# Patient Record
Sex: Female | Born: 1975 | Race: Black or African American | Hispanic: No | Smoking: Current every day smoker
Health system: Southern US, Community
[De-identification: ages and names within clinical notes are randomized; demographics above are authoritative.]

## PROBLEM LIST (undated history)

## (undated) DIAGNOSIS — R112 Nausea with vomiting, unspecified: Secondary | ICD-10-CM

## (undated) DIAGNOSIS — D259 Leiomyoma of uterus, unspecified: Secondary | ICD-10-CM

## (undated) DIAGNOSIS — Z973 Presence of spectacles and contact lenses: Secondary | ICD-10-CM

## (undated) DIAGNOSIS — M1990 Unspecified osteoarthritis, unspecified site: Secondary | ICD-10-CM

## (undated) DIAGNOSIS — T8859XA Other complications of anesthesia, initial encounter: Secondary | ICD-10-CM

## (undated) DIAGNOSIS — Z87898 Personal history of other specified conditions: Secondary | ICD-10-CM

## (undated) DIAGNOSIS — J3089 Other allergic rhinitis: Secondary | ICD-10-CM

## (undated) DIAGNOSIS — F4310 Post-traumatic stress disorder, unspecified: Secondary | ICD-10-CM

## (undated) DIAGNOSIS — Z9889 Other specified postprocedural states: Secondary | ICD-10-CM

## (undated) DIAGNOSIS — F419 Anxiety disorder, unspecified: Secondary | ICD-10-CM

## (undated) DIAGNOSIS — J45909 Unspecified asthma, uncomplicated: Secondary | ICD-10-CM

## (undated) DIAGNOSIS — Z975 Presence of (intrauterine) contraceptive device: Secondary | ICD-10-CM

## (undated) DIAGNOSIS — K219 Gastro-esophageal reflux disease without esophagitis: Secondary | ICD-10-CM

## (undated) DIAGNOSIS — Z87828 Personal history of other (healed) physical injury and trauma: Secondary | ICD-10-CM

## (undated) DIAGNOSIS — T4145XA Adverse effect of unspecified anesthetic, initial encounter: Secondary | ICD-10-CM

## (undated) HISTORY — PX: OTHER SURGICAL HISTORY: SHX169

---

## 2011-02-18 ENCOUNTER — Ambulatory Visit
Admission: RE | Admit: 2011-02-18 | Discharge: 2011-02-18 | Disposition: A | Discharge status: Home / Self Care | Payer: Worker's Compensation | Source: Ambulatory Visit | Attending: Emergency Medicine | Admitting: Emergency Medicine

## 2011-02-18 ENCOUNTER — Other Ambulatory Visit: Payer: Self-pay | Admitting: Emergency Medicine

## 2011-02-18 DIAGNOSIS — T1490XA Injury, unspecified, initial encounter: Secondary | ICD-10-CM

## 2011-02-18 DIAGNOSIS — R52 Pain, unspecified: Secondary | ICD-10-CM

## 2012-04-23 NOTE — Progress Notes (Signed)
Need orders put in EPIC as patient has pre-op appt. Mon. 04/26/2012! Thank you!

## 2012-04-26 ENCOUNTER — Encounter (HOSPITAL_COMMUNITY): Payer: Self-pay | Admitting: Pharmacy Technician

## 2012-04-26 ENCOUNTER — Encounter (HOSPITAL_COMMUNITY): Payer: Self-pay

## 2012-04-26 ENCOUNTER — Encounter (HOSPITAL_COMMUNITY)
Admission: RE | Admit: 2012-04-26 | Discharge: 2012-04-26 | Disposition: A | Discharge status: Home / Self Care | Payer: Worker's Compensation | Source: Ambulatory Visit | Attending: Specialist | Admitting: Specialist

## 2012-04-26 ENCOUNTER — Other Ambulatory Visit: Payer: Self-pay | Admitting: Specialist

## 2012-04-26 HISTORY — DX: Gastro-esophageal reflux disease without esophagitis: K21.9

## 2012-04-26 HISTORY — DX: Adverse effect of unspecified anesthetic, initial encounter: T41.45XA

## 2012-04-26 HISTORY — DX: Presence of (intrauterine) contraceptive device: Z97.5

## 2012-04-26 HISTORY — DX: Other complications of anesthesia, initial encounter: T88.59XA

## 2012-04-26 HISTORY — DX: Unspecified osteoarthritis, unspecified site: M19.90

## 2012-04-26 LAB — HCG, SERUM, QUALITATIVE: Preg, Serum: NEGATIVE

## 2012-04-26 LAB — CBC: Platelets: 344 10*3/uL (ref 150–400)

## 2012-04-26 NOTE — Pre-Procedure Instructions (Signed)
CBC, SERUM PREGNANCY  WERE DONE TODAY - PREOP - AT [facility] AS PER ANESTHESIOLOGIST'S GUIDELINES.

## 2012-04-26 NOTE — Patient Instructions (Signed)
YOUR SURGERY IS SCHEDULED AT [facility]  ON:  Wednesday  12/18  REPORT TO [place] [facility] AT:  9:00 AM      PHONE # FOR SHORT STAY IS [phone]  DO NOT EAT OR DRINK ANYTHING AFTER MIDNIGHT THE NIGHT BEFORE YOUR SURGERY.  YOU MAY BRUSH YOUR TEETH, RINSE OUT YOUR MOUTH--BUT NO WATER, NO FOOD, NO CHEWING GUM, NO MINTS, NO CANDIES, NO CHEWING TOBACCO.  PLEASE TAKE THE FOLLOWING MEDICATIONS THE AM OF YOUR SURGERY WITH A FEW SIPS OF WATER:  NO MEDS TO TAKE  IF YOU USE INHALERS--USE YOUR INHALERS THE AM OF YOUR SURGERY AND BRING INHALERS TO THE HOSPITAL -TAKE TO SURGERY.    IF YOU ARE DIABETIC:  DO NOT TAKE ANY DIABETIC MEDICATIONS THE AM OF YOUR SURGERY.  IF YOU TAKE INSULIN IN THE EVENINGS--PLEASE ONLY TAKE 1/2 NORMAL EVENING DOSE THE NIGHT BEFORE YOUR SURGERY.  NO INSULIN THE AM OF YOUR SURGERY.  IF YOU HAVE SLEEP APNEA AND USE CPAP OR BIPAP--PLEASE BRING THE MASK AND THE TUBING.  DO NOT BRING YOUR MACHINE.  DO NOT BRING VALUABLES, MONEY, CREDIT CARDS.  DO NOT WEAR JEWELRY, MAKE-UP, NAIL POLISH AND NO METAL PINS OR CLIPS IN YOUR HAIR. CONTACT LENS, DENTURES / PARTIALS, GLASSES SHOULD NOT BE WORN TO SURGERY AND IN MOST CASES-HEARING AIDS WILL NEED TO BE REMOVED.  BRING YOUR GLASSES CASE, ANY EQUIPMENT NEEDED FOR YOUR CONTACT LENS. FOR PATIENTS ADMITTED TO THE HOSPITAL--CHECK OUT TIME THE DAY OF DISCHARGE IS 11:00 AM.  ALL INPATIENT ROOMS ARE PRIVATE - WITH BATHROOM, TELEPHONE, TELEVISION AND WIFI INTERNET.  IF YOU ARE BEING DISCHARGED THE SAME DAY OF YOUR SURGERY--YOU CAN NOT DRIVE YOURSELF HOME--AND SHOULD NOT GO HOME ALONE BY TAXI OR BUS.  NO DRIVING OR OPERATING MACHINERY FOR 24 HOURS FOLLOWING ANESTHESIA / PAIN MEDICATIONS.  PLEASE MAKE ARRANGEMENTS FOR SOMEONE TO BE WITH YOU AT HOME THE FIRST 24 HOURS AFTER SURGERY. RESPONSIBLE DRIVER'S NAME___________________________                                               PHONE #   _______________________               PLEASE READ OVER ANY  FACT SHEETS THAT YOU WERE GIVEN: MRSA INFORMATION, BLOOD TRANSFUSION INFORMATION, INCENTIVE SPIROMETER INFORMATION. FAILURE TO FOLLOW THESE INSTRUCTIONS MAY RESULT IN THE CANCELLATION OF YOUR SURGERY.   PATIENT SIGNATURE_________________________________

## 2012-04-27 ENCOUNTER — Other Ambulatory Visit: Payer: Self-pay | Admitting: Orthopedic Surgery

## 2012-04-28 ENCOUNTER — Encounter (HOSPITAL_COMMUNITY): Payer: Self-pay | Admitting: *Deleted

## 2012-04-28 ENCOUNTER — Ambulatory Visit (HOSPITAL_COMMUNITY): Payer: Worker's Compensation

## 2012-04-28 ENCOUNTER — Ambulatory Visit (HOSPITAL_COMMUNITY): Payer: Worker's Compensation | Admitting: Anesthesiology

## 2012-04-28 ENCOUNTER — Encounter (HOSPITAL_COMMUNITY): Payer: Self-pay | Admitting: Anesthesiology

## 2012-04-28 ENCOUNTER — Encounter (HOSPITAL_COMMUNITY)
Admission: RE | Disposition: A | Discharge status: Home / Self Care | Payer: Self-pay | Source: Ambulatory Visit | Attending: Specialist

## 2012-04-28 ENCOUNTER — Ambulatory Visit (HOSPITAL_COMMUNITY)
Admission: RE | Admit: 2012-04-28 | Discharge: 2012-04-28 | Disposition: A | Discharge status: Home / Self Care | Payer: Worker's Compensation | Source: Ambulatory Visit | Attending: Specialist | Admitting: Specialist

## 2012-04-28 DIAGNOSIS — M7500 Adhesive capsulitis of unspecified shoulder: Secondary | ICD-10-CM | POA: Insufficient documentation

## 2012-04-28 DIAGNOSIS — Z5333 Arthroscopic surgical procedure converted to open procedure: Secondary | ICD-10-CM | POA: Insufficient documentation

## 2012-04-28 DIAGNOSIS — M24119 Other articular cartilage disorders, unspecified shoulder: Secondary | ICD-10-CM | POA: Insufficient documentation

## 2012-04-28 DIAGNOSIS — M25819 Other specified joint disorders, unspecified shoulder: Secondary | ICD-10-CM | POA: Insufficient documentation

## 2012-04-28 DIAGNOSIS — M19019 Primary osteoarthritis, unspecified shoulder: Secondary | ICD-10-CM | POA: Insufficient documentation

## 2012-04-28 DIAGNOSIS — M7541 Impingement syndrome of right shoulder: Secondary | ICD-10-CM

## 2012-04-28 DIAGNOSIS — Z01812 Encounter for preprocedural laboratory examination: Secondary | ICD-10-CM | POA: Insufficient documentation

## 2012-04-28 DIAGNOSIS — Z79899 Other long term (current) drug therapy: Secondary | ICD-10-CM | POA: Insufficient documentation

## 2012-04-28 DIAGNOSIS — K219 Gastro-esophageal reflux disease without esophagitis: Secondary | ICD-10-CM | POA: Insufficient documentation

## 2012-04-28 HISTORY — PX: SHOULDER OPEN ROTATOR CUFF REPAIR: SHX2407

## 2012-04-28 HISTORY — PX: SHOULDER ARTHROSCOPY: SHX128

## 2012-04-28 SURGERY — ARTHROSCOPY, SHOULDER
Anesthesia: General | Site: Shoulder | Laterality: Right | Wound class: Clean

## 2012-04-28 MED ORDER — LIDOCAINE HCL (CARDIAC) 20 MG/ML IV SOLN
INTRAVENOUS | Status: DC | PRN
  Administered 2012-04-28: 50 mg via INTRAVENOUS

## 2012-04-28 MED ORDER — HYDROMORPHONE HCL PF 1 MG/ML IJ SOLN
INTRAMUSCULAR | Status: DC | PRN
  Administered 2012-04-28 (×4): 0.5 mg via INTRAVENOUS

## 2012-04-28 MED ORDER — HYDROCODONE-ACETAMINOPHEN 7.5-325 MG PO TABS
1.0000 | ORAL_TABLET | ORAL | Status: DC | PRN
  Administered 2012-04-28: 1 via ORAL

## 2012-04-28 MED ORDER — ROCURONIUM BROMIDE 100 MG/10ML IV SOLN
INTRAVENOUS | Status: DC | PRN
  Administered 2012-04-28: 50 mg via INTRAVENOUS

## 2012-04-28 MED ORDER — CEPHALEXIN 500 MG PO CAPS: 500.0000 mg | ORAL_CAPSULE | Freq: Four times a day (QID) | ORAL | Status: DC

## 2012-04-28 MED ORDER — ONDANSETRON HCL 4 MG/2ML IJ SOLN
INTRAMUSCULAR | Status: DC | PRN
  Administered 2012-04-28: 4 mg via INTRAVENOUS

## 2012-04-28 MED ORDER — LACTATED RINGERS IV SOLN
INTRAVENOUS | Status: DC
  Administered 2012-04-28: 1000 mL via INTRAVENOUS
  Administered 2012-04-28: 12:00:00 via INTRAVENOUS

## 2012-04-28 MED ORDER — HYDROMORPHONE HCL PF 1 MG/ML IJ SOLN: 0.2500 mg | INTRAMUSCULAR | Status: DC | PRN

## 2012-04-28 MED ORDER — BUPIVACAINE-EPINEPHRINE 0.5% -1:200000 IJ SOLN
INTRAMUSCULAR | Status: DC | PRN
  Administered 2012-04-28: 30 mL

## 2012-04-28 MED ORDER — ACETAMINOPHEN 10 MG/ML IV SOLN
INTRAVENOUS | Status: DC | PRN
  Administered 2012-04-28: 1000 mg via INTRAVENOUS

## 2012-04-28 MED ORDER — KETOROLAC TROMETHAMINE 10 MG PO TABS: 10.0000 mg | ORAL_TABLET | Freq: Four times a day (QID) | ORAL | Status: DC | PRN

## 2012-04-28 MED ORDER — HYDROCODONE-ACETAMINOPHEN 7.5-325 MG PO TABS: 1.0000 | ORAL_TABLET | ORAL | Status: DC | PRN

## 2012-04-28 MED ORDER — KETOROLAC TROMETHAMINE 30 MG/ML IJ SOLN
15.0000 mg | Freq: Once | INTRAMUSCULAR | Status: AC | PRN
  Administered 2012-04-28: 30 mg via INTRAVENOUS

## 2012-04-28 MED ORDER — FENTANYL CITRATE 0.05 MG/ML IJ SOLN
INTRAMUSCULAR | Status: DC | PRN
  Administered 2012-04-28 (×2): 100 ug via INTRAVENOUS

## 2012-04-28 MED ORDER — SODIUM CHLORIDE 0.9 % IR SOLN
Status: DC | PRN
  Administered 2012-04-28: 12:00:00

## 2012-04-28 MED ORDER — PROPOFOL 10 MG/ML IV BOLUS
INTRAVENOUS | Status: DC | PRN
  Administered 2012-04-28: 200 mg via INTRAVENOUS

## 2012-04-28 MED ORDER — METOCLOPRAMIDE HCL 5 MG/ML IJ SOLN
INTRAMUSCULAR | Status: DC | PRN
  Administered 2012-04-28: 10 mg via INTRAVENOUS

## 2012-04-28 MED ORDER — MIDAZOLAM HCL 5 MG/5ML IJ SOLN
INTRAMUSCULAR | Status: DC | PRN
  Administered 2012-04-28: 2 mg via INTRAVENOUS

## 2012-04-28 MED ORDER — ONDANSETRON HCL 8 MG PO TABS: 8.0000 mg | ORAL_TABLET | Freq: Three times a day (TID) | ORAL | Status: DC | PRN

## 2012-04-28 MED ORDER — DEXTROSE 5 % IV SOLN
3.0000 g | INTRAVENOUS | Status: AC
  Administered 2012-04-28: 2 g via INTRAVENOUS
  Filled 2012-04-28: qty 3000

## 2012-04-28 MED ORDER — PROMETHAZINE HCL 25 MG/ML IJ SOLN
6.2500 mg | INTRAMUSCULAR | Status: DC | PRN
  Administered 2012-04-28: 6.25 mg via INTRAVENOUS
  Filled 2012-04-28: qty 1

## 2012-04-28 MED ORDER — CHLORHEXIDINE GLUCONATE 4 % EX LIQD
60.0000 mL | Freq: Once | CUTANEOUS | Status: DC
  Filled 2012-04-28: qty 60

## 2012-04-28 MED ORDER — DEXAMETHASONE SODIUM PHOSPHATE 10 MG/ML IJ SOLN
INTRAMUSCULAR | Status: DC | PRN
  Administered 2012-04-28: 5 mg via INTRAVENOUS

## 2012-04-28 MED ORDER — LACTATED RINGERS IR SOLN
Status: DC | PRN
  Administered 2012-04-28: 6000 mL

## 2012-04-28 MED ORDER — EPINEPHRINE HCL 1 MG/ML IJ SOLN
INTRAMUSCULAR | Status: DC | PRN
  Administered 2012-04-28: 2 mg

## 2012-04-28 SURGICAL SUPPLY — 65 items
ANCHOR ROTATOR CUFF #2 (Anchor) ×4 IMPLANT
BAG ZIPLOCK 12X15 (MISCELLANEOUS) ×2 IMPLANT
BENZOIN TINCTURE PRP APPL 2/3 (GAUZE/BANDAGES/DRESSINGS) ×2 IMPLANT
BLADE CUTTER GATOR 3.5 (BLADE) ×2 IMPLANT
BLADE OSCILLATING/SAGITTAL (BLADE) ×1
BLADE SURG SZ11 CARB STEEL (BLADE) ×2 IMPLANT
BLADE SW THK.38XMED LNG THN (BLADE) ×1 IMPLANT
BNDG COHESIVE 4X5 WHT NS (GAUZE/BANDAGES/DRESSINGS) ×2 IMPLANT
BUR OVAL 4.0 (BURR) ×2 IMPLANT
BUR OVAL CARBIDE 4.0 (BURR) ×2 IMPLANT
CANNULA ACUFO 5X76 (CANNULA) ×2 IMPLANT
CHLORAPREP W/TINT 26ML (MISCELLANEOUS) IMPLANT
CLEANER TIP ELECTROSURG 2X2 (MISCELLANEOUS) ×2 IMPLANT
CLOSURE STERI-STRIP 1/4X4 (GAUZE/BANDAGES/DRESSINGS) ×2 IMPLANT
CLOTH BEACON ORANGE TIMEOUT ST (SAFETY) ×2 IMPLANT
DECANTER SPIKE VIAL GLASS SM (MISCELLANEOUS) ×2 IMPLANT
DRAPE ORTHO SPLIT 77X108 STRL (DRAPES) ×1
DRAPE POUCH INSTRU U-SHP 10X18 (DRAPES) ×2 IMPLANT
DRAPE SURG ORHT 6 SPLT 77X108 (DRAPES) ×1 IMPLANT
DRAPE U-SHAPE 47X51 STRL (DRAPES) ×2 IMPLANT
DRSG EMULSION OIL 3X3 NADH (GAUZE/BANDAGES/DRESSINGS) ×2 IMPLANT
DURAPREP 26ML APPLICATOR (WOUND CARE) ×2 IMPLANT
ELECT NEEDLE TIP 2.8 STRL (NEEDLE) ×2 IMPLANT
ELECT REM PT RETURN 9FT ADLT (ELECTROSURGICAL) ×2
ELECTRODE REM PT RTRN 9FT ADLT (ELECTROSURGICAL) ×1 IMPLANT
GLOVE BIOGEL PI IND STRL 7.5 (GLOVE) ×1 IMPLANT
GLOVE BIOGEL PI IND STRL 8 (GLOVE) ×1 IMPLANT
GLOVE BIOGEL PI INDICATOR 7.5 (GLOVE) ×1
GLOVE BIOGEL PI INDICATOR 8 (GLOVE) ×1
GLOVE INDICATOR 6.5 STRL GRN (GLOVE) ×2 IMPLANT
GLOVE SURG SS PI 7.0 STRL IVOR (GLOVE) ×2 IMPLANT
GLOVE SURG SS PI 8.0 STRL IVOR (GLOVE) ×4 IMPLANT
GOWN PREVENTION PLUS LG XLONG (DISPOSABLE) ×2 IMPLANT
GOWN PREVENTION PLUS XLARGE (GOWN DISPOSABLE) ×2 IMPLANT
GOWN STRL REIN XL XLG (GOWN DISPOSABLE) ×2 IMPLANT
KIT BASIN OR (CUSTOM PROCEDURE TRAY) ×2 IMPLANT
MANIFOLD NEPTUNE II (INSTRUMENTS) ×4 IMPLANT
NEEDLE MA TROC 1/2 (NEEDLE) ×2 IMPLANT
NEEDLE MA TROC 1/2 CIR (NEEDLE) IMPLANT
NEEDLE SPNL 18GX3.5 QUINCKE PK (NEEDLE) ×2 IMPLANT
PACK SHOULDER CUSTOM OPM052 (CUSTOM PROCEDURE TRAY) ×2 IMPLANT
PAD ABD 7.5X8 STRL (GAUZE/BANDAGES/DRESSINGS) ×2 IMPLANT
POSITIONER SURGICAL ARM (MISCELLANEOUS) ×2 IMPLANT
PUSHLOCK PEEK 4.5X24 (Orthopedic Implant) ×4 IMPLANT
SET ARTHROSCOPY TUBING (MISCELLANEOUS) ×1
SET ARTHROSCOPY TUBING LN (MISCELLANEOUS) ×1 IMPLANT
SLING ARM IMMOBILIZER LRG (SOFTGOODS) ×2 IMPLANT
SLING ARM IMMOBILIZER MED (SOFTGOODS) IMPLANT
SLING ULTRA II S (ORTHOPEDIC SUPPLIES) ×2 IMPLANT
SPONGE GAUZE 4X4 12PLY (GAUZE/BANDAGES/DRESSINGS) ×2 IMPLANT
SPONGE LAP 4X18 X RAY DECT (DISPOSABLE) ×2 IMPLANT
STRAP CHIN BCCS-OSI (MISCELLANEOUS) ×2 IMPLANT
STRIP CLOSURE SKIN 1/2X4 (GAUZE/BANDAGES/DRESSINGS) ×2 IMPLANT
SUT BONE WAX W31G (SUTURE) ×2 IMPLANT
SUT ETHIBOND 0 (SUTURE) ×4 IMPLANT
SUT ETHIBOND 2 OS 4 DA (SUTURE) ×4 IMPLANT
SUT ETHILON 4 0 PS 2 18 (SUTURE) ×2 IMPLANT
SUT PROLENE 3 0 PS 2 (SUTURE) ×2 IMPLANT
SUT VIC AB 1-0 CT2 27 (SUTURE) ×4 IMPLANT
SUT VIC AB 2-0 CT2 27 (SUTURE) ×2 IMPLANT
SUT VICRYL 0 UR6 27IN ABS (SUTURE) ×4 IMPLANT
SUT VICRYL 0-0 OS 2 NEEDLE (SUTURE) ×2 IMPLANT
TAPE CLOTH SURG 4X10 WHT LF (GAUZE/BANDAGES/DRESSINGS) ×2 IMPLANT
TUBING CONNECTING 10 (TUBING) ×2 IMPLANT
WAND 90 DEG TURBOVAC W/CORD (SURGICAL WAND) ×2 IMPLANT

## 2012-04-28 NOTE — Progress Notes (Signed)
Pt still sleeping.  She does wake to voice and follows commands but easily back to sleep.

## 2012-04-28 NOTE — Progress Notes (Signed)
Pt has met criteria to be discharged; however, she is waiting on family to come from [place], [place] to get her.

## 2012-04-28 NOTE — H&P (Signed)
Traci Schwartz is an 36 y.o. female.   Chief Complaint: Right shoulder pain HPI: Refractory shoulder pain Frozen shoulder MRI no tear  Past Medical History  Diagnosis Date  . IUD (intrauterine device) in place     HAS OCCAS MENSES  . GERD (gastroesophageal reflux disease)     OCCAS- NO MEDS  . Arthritis   . Pain     RIGHT SHOULDER  . Complication of anesthesia     ANESTHESIA AWARENESS DURING BOTH C-SECTIONS; SEVERE CLAUSTROPHOBIA    Past Surgical History  Procedure Date  . C-sections x 2     History reviewed. No pertinent family history. Social History:  reports that she has been smoking Cigarettes.  She has a .75 pack-year smoking history. She has never used smokeless tobacco. She reports that she drinks alcohol. She reports that she does not use illicit drugs.  Allergies: No Known Allergies  Medications Prior to Admission  Medication Sig Dispense Refill  . acetaminophen (TYLENOL) 500 MG tablet Take 1,000 mg by mouth every 6 (six) hours as needed. Pain      . traMADol (ULTRAM) 50 MG tablet Take 50 mg by mouth every 6 (six) hours as needed. Pain        Results for orders placed during the hospital encounter of 04/26/12 (from the past 48 hour(s))  SURGICAL PCR SCREEN     Status: Normal   Collection Time   04/26/12 10:10 AM      Component Value Range Comment   MRSA, PCR NEGATIVE  NEGATIVE    Staphylococcus aureus NEGATIVE  NEGATIVE   CBC     Status: Normal   Collection Time   04/26/12 11:05 AM      Component Value Range Comment   WBC 9.6  4.0 - 10.5 K/uL    RBC 4.97  3.87 - 5.11 MIL/uL    Hemoglobin 14.2  12.0 - 15.0 g/dL    HCT 13.0  86.5 - 78.4 %    MCV 84.7  78.0 - 100.0 fL    MCH 28.6  26.0 - 34.0 pg    MCHC 33.7  30.0 - 36.0 g/dL    RDW 69.6  29.5 - 28.4 %    Platelets 344  150 - 400 K/uL   HCG, SERUM, QUALITATIVE     Status: Normal   Collection Time   04/26/12 11:05 AM      Component Value Range Comment   Preg, Serum NEGATIVE  NEGATIVE    No results  found.  Review of Systems  Musculoskeletal: Positive for joint pain.  All other systems reviewed and are negative.    Blood pressure 142/80, pulse 75, temperature 98.3 F (36.8 C), temperature source Oral, resp. rate 18, SpO2 100.00%. Physical Exam  Constitutional: She is oriented to person, place, and time. She appears well-developed.  HENT:  Head: Normocephalic.  Eyes: Pupils are equal, round, and reactive to light.  Neck: Normal range of motion. Neck supple.  Cardiovascular: Normal rate.   Respiratory: Effort normal.  GI: Soft.  Musculoskeletal:       +impingement right. Tender AC. Decreased ROM right. NVI.   Neurological: She is alert and oriented to person, place, and time.  Skin: Skin is warm and dry.  Psychiatric: She has a normal mood and affect.   MRI RC tendonosis, mild AC arthrosis. No full thicjness tear.  Assessment/Plan Right shoulder impingement syndrome RC arthropathy, adhesive capsulitis, AC arthrosis refractory Plan Right SA SAD probable DCR, MUA EUA. Risks discussed.  Chloie Loney C 04/28/2012, 10:09 AM

## 2012-04-28 NOTE — Anesthesia Preprocedure Evaluation (Addendum)
Anesthesia Evaluation  Patient identified by MRN, date of birth, ID band Patient awake    Reviewed: Allergy & Precautions, H&P , NPO status , Patient's Chart, lab work & pertinent test results  Airway Mallampati: III TM Distance: <3 FB Neck ROM: Full    Dental No notable dental hx. (+) Dental Advisory Given   Pulmonary Current Smoker,  breath sounds clear to auscultation  + decreased breath sounds      Cardiovascular negative cardio ROS  Rhythm:Regular Rate:Normal     Neuro/Psych PTSDnegative neurological ROS  negative psych ROS   GI/Hepatic Neg liver ROS, GERD-  ,  Endo/Other  Morbid obesity  Renal/GU negative Renal ROS  negative genitourinary   Musculoskeletal negative musculoskeletal ROS (+)   Abdominal   Peds negative pediatric ROS (+)  Hematology negative hematology ROS (+)   Anesthesia Other Findings   Reproductive/Obstetrics negative OB ROS                          Anesthesia Physical Anesthesia Plan  ASA: III  Anesthesia Plan: General   Post-op Pain Management:    Induction: Intravenous  Airway Management Planned: Oral ETT  Additional Equipment:   Intra-op Plan:   Post-operative Plan: Extubation in OR  Informed Consent: I have reviewed the patients History and Physical, chart, labs and discussed the procedure including the risks, benefits and alternatives for the proposed anesthesia with the patient or authorized representative who has indicated his/her understanding and acceptance.   Dental advisory given  Plan Discussed with: CRNA and Surgeon  Anesthesia Plan Comments:         Anesthesia Quick Evaluation

## 2012-04-28 NOTE — Transfer of Care (Signed)
Immediate Anesthesia Transfer of Care Note  Patient: Traci Schwartz  Procedure(s) Performed: Procedure(s) (LRB) with comments: ARTHROSCOPY SHOULDER (Right) - Right Shoulder Arthroscopy with Debridement, Evaluation under Anesthesia, Manipulation Under Anesthesia and Possible Mini Rotator Cuff Repair and Distal Clavicle Resection ROTATOR CUFF REPAIR SHOULDER OPEN (Right)  Patient Location: PACU  Anesthesia Type:General  Level of Consciousness: awake, alert , oriented and patient cooperative  Airway & Oxygen Therapy: Patient Spontanous Breathing and Patient connected to face mask oxygen  Post-op Assessment: Report given to PACU RN, Post -op Vital signs reviewed and stable and Patient moving all extremities  Post vital signs: Reviewed and stable  Complications: No apparent anesthesia complications

## 2012-04-28 NOTE — Anesthesia Postprocedure Evaluation (Signed)
  Anesthesia Post-op Note  Patient: Traci Schwartz  Procedure(s) Performed: Procedure(s) (LRB): ARTHROSCOPY SHOULDER (Right) ROTATOR CUFF REPAIR SHOULDER OPEN (Right)  Patient Location: PACU  Anesthesia Type: General  Level of Consciousness: awake and alert   Airway and Oxygen Therapy: Patient Spontanous Breathing  Post-op Pain: mild  Post-op Assessment: Post-op Vital signs reviewed, Patient's Cardiovascular Status Stable, Respiratory Function Stable, Patent Airway and No signs of Nausea or vomiting  Last Vitals:  Filed Vitals:   04/28/12 1411  BP: 125/75  Pulse: 72  Temp: 36.3 C  Resp: 20    Post-op Vital Signs: stable   Complications: No apparent anesthesia complications

## 2012-04-28 NOTE — Brief Op Note (Signed)
04/28/2012  12:59 PM  PATIENT:  Traci Schwartz  36 y.o. female  PRE-OPERATIVE DIAGNOSIS:  Right Shoulder Impingement Syndrome and Adhesive Capsulitis  POST-OPERATIVE DIAGNOSIS:  Right Shoulder Impingement Syndrome and Adhesive Capsulitis  PROCEDURE:  Procedure(s) (LRB) with comments: ARTHROSCOPY SHOULDER (Right) - Right Shoulder Arthroscopy with Debridement, Evaluation under Anesthesia, Manipulation Under Anesthesia and Possible Mini Rotator Cuff Repair and Distal Clavicle Resection ROTATOR CUFF REPAIR SHOULDER OPEN (Right)  SURGEON:  Surgeon(s) and Role:    * Javier Docker, MD - Primary  PHYSICIAN ASSISTANT:   ASSISTANTS: Bissell   ANESTHESIA:   general  EBL:  Total I/O In: 1000 [I.V.:1000] Out: -   BLOOD ADMINISTERED:none  DRAINS: none   LOCAL MEDICATIONS USED:  MARCAINE     SPECIMEN:  No Specimen  DISPOSITION OF SPECIMEN:  N/A  COUNTS:  YES  TOURNIQUET:  * No tourniquets in log *  DICTATION: .Other Dictation: Dictation Number [id number]  PLAN OF CARE: Discharge to home after PACU  PATIENT DISPOSITION:  PACU - hemodynamically stable.   Delay start of Pharmacological VTE agent (>24hrs) due to surgical blood loss or risk of bleeding: no

## 2012-04-29 ENCOUNTER — Encounter (HOSPITAL_COMMUNITY): Payer: Self-pay | Admitting: Specialist

## 2012-04-29 NOTE — Op Note (Signed)
[name], Schwartz NO.:  [id number]  MEDICAL RECORD NO.:  [id number]  LOCATION:  [facility]                         FACILITY:  [facility]  PHYSICIAN:  Jene Every, M.D.    DATE OF BIRTH:  06-03-1975  DATE OF PROCEDURE:  04/28/2012 DATE OF DISCHARGE:  04/28/2012                              OPERATIVE REPORT   PREOPERATIVE DIAGNOSES:  Impingement syndrome of the shoulder, adhesive capsulitis, labral tear, acromioclavicular arthrosis.  POSTOPERATIVE DIAGNOSES:  Impingement syndrome of the shoulder, adhesive capsulitis, labral tear, acromioclavicular arthrosis.  PROCEDURES PERFORMED: 1. Exam under anesthesia followed by manipulation under anesthesia. 2. Right shoulder arthroscopy with debridement of superior anterior     labrum. 3. Arthroscopic subacromial decompression, release of CA ligament,     arthroscopic assisted mini open distal clavicle resection.  ANESTHESIA:  General.  ASSISTANT:  Norman Herrlich.  HISTORY:  A 36 year old injured at work.  She had persistent pain, limitation and range of motion of shoulder, had 2-3 injections of the [facility] joint with temporary relief, subacromial injections with temporary relief.  An MRI indicating labral tear, rotator cuff arthropathy without evidence of tear, mild AC arthrosis, failing conservative treatment with temporary relief from the subacromial and AC injection.  We discussed shoulder arthroscopy, exam under anesthesia, manipulation if necessary, subacromial decompression, distal clavicle resection either arthroscopic or open.  Risks and benefits discussed including bleeding, infection, damage to neurovascular structures, no change in symptoms, worrisome symptoms, DVT, PE, anesthetic complications, etc.  TECHNIQUE:  Supine beach-chair position after induction of adequate general anesthesia, 2 g Kefzol, the right shoulder and upper extremity was prepped and draped in usual sterile fashion.  Exam under  anesthesia just prior to that that revealed a full range of motion.  With the arm in the 70/30 position and gentle traction applied, we fashioned a posterior lateral portal with incision through the skin only with #11 blade after marking the outline of the acromion AC joint coracoid.  We advanced the cannula into the glenohumeral space penetrating atraumatically.  Irrigant was utilized to insufflate the joint.  Only a single portal cannula was available.  We lavaged the joint.  Inspection revealed tearing of the superior, posterior, and anterior aspect of the superior labrum.  The subscap biceps tendon, rotator cuff was without evidence of tear and the glenohumeral joint did not demonstrate any significant glenohumeral arthrosis.  Under direct visualization between the coracoid and the anterolateral aspect of the acromion near the [facility] joint, I localized anterior portal with an 18-gauge needle, entering the capsule just beneath the biceps tendon.  I made a small incision with a #11 blade.  I advanced the cannula under direct visualization into the capsule beneath the biceps tendon.  I introduced a probe and probed. There was no detachment of the labrum. I introduced a shaver and debrided the labrum to its stable base.  Following that, this cannula was removed.  I then redirected the camera in the subacromial space, made a small incision over the anterolateral aspect of the acromion and triangulated with an arthroscopic cannula.  Irrigant was utilized to insufflate the joint.  Hypertrophic bursa was noted in the subacromial space.  I therefore introduced a shaver  and performed a full bursectomy. Rotator cuff was hyperemic but no evidence of a tear.  There was a impingement lesion consisting of the anterolateral aspect of the acromion and the CA ligament which was hypertrophied.  I introduced an ArthroWand and released and morselized the [facility] joint preserving the deltoid attachment.  We shaved  the anterolateral aspect of the acromion. Due to the patient's size there, we went to the end of the capsule of the distal clavicle and able to produce distal clavicle under satisfactory visualization in the subacromial space.  Therefore, after localizing it, I decided to convert to a mini open distal clavicle resection given her persistent pain noted preoperatively.  Removed all arthroscopic equipment.  Closed the portals with 4-0 nylon simple sutures.  A small incision was made over the anterior aspect of the acromion with a saber-type incision about 1.5 cm to 2 cm in length.  I identified the distal AC joint and did a longitudinal capsulotomy, skeletonized the distal clavicle with the AO elevator, preserving the capsule after dividing the deltotrapezial fascia.  Actually there was a fair amount of fairly small joint there, somewhat difficulty identifying that which we did with the 18-gauge needle.  We then debrided the portion of the [facility] joint and protected the distal clavicle anteriorly and posteriorly with Hohmann retractors and Hohmann retractor beneath it as well.  I used oscillating saw to perform a cm of removal of distal clavicle.  Preserving the cuff underneath there, we undercut it with 3 mm Kerrison.  Preserving the coracoclavicular ligament.  No hypermobility was noted.  The rotator cuff was intact.  I copiously irrigated the wound.  No active bleeding was noted.  No impingement was noted.  Again it was undercut with a 3 mm Kerrison.  I then repaired the capsule meticulously with 0 Vicryl simple sutures, the deltotrapezial fascia with 2-0 Vicryl simple sutures, subcu with 2-0 Vicryl.  Skin reapproximated with 4-0 subcuticular Prolene.  Wound reinforced with Steri-Strips, sterile dressing applied.  Placed in a sling, extubated without difficulty, and transported to recovery room in satisfactory condition.  The patient tolerated the procedure well.  No complications.   Minimal blood loss.     Jene Every, M.D.     Cordelia Pen  D:  04/28/2012  T:  04/29/2012  Job:  [id number]

## 2012-09-04 IMAGING — CR DG SHOULDER 2+V*R*
3 series · 3 of 3 positions shown · non-contrast
Comparison: None.

CLINICAL DATA: Trauma.

RIGHT SHOULDER - 2+ VIEW

[view not recorded (1 of 3)]
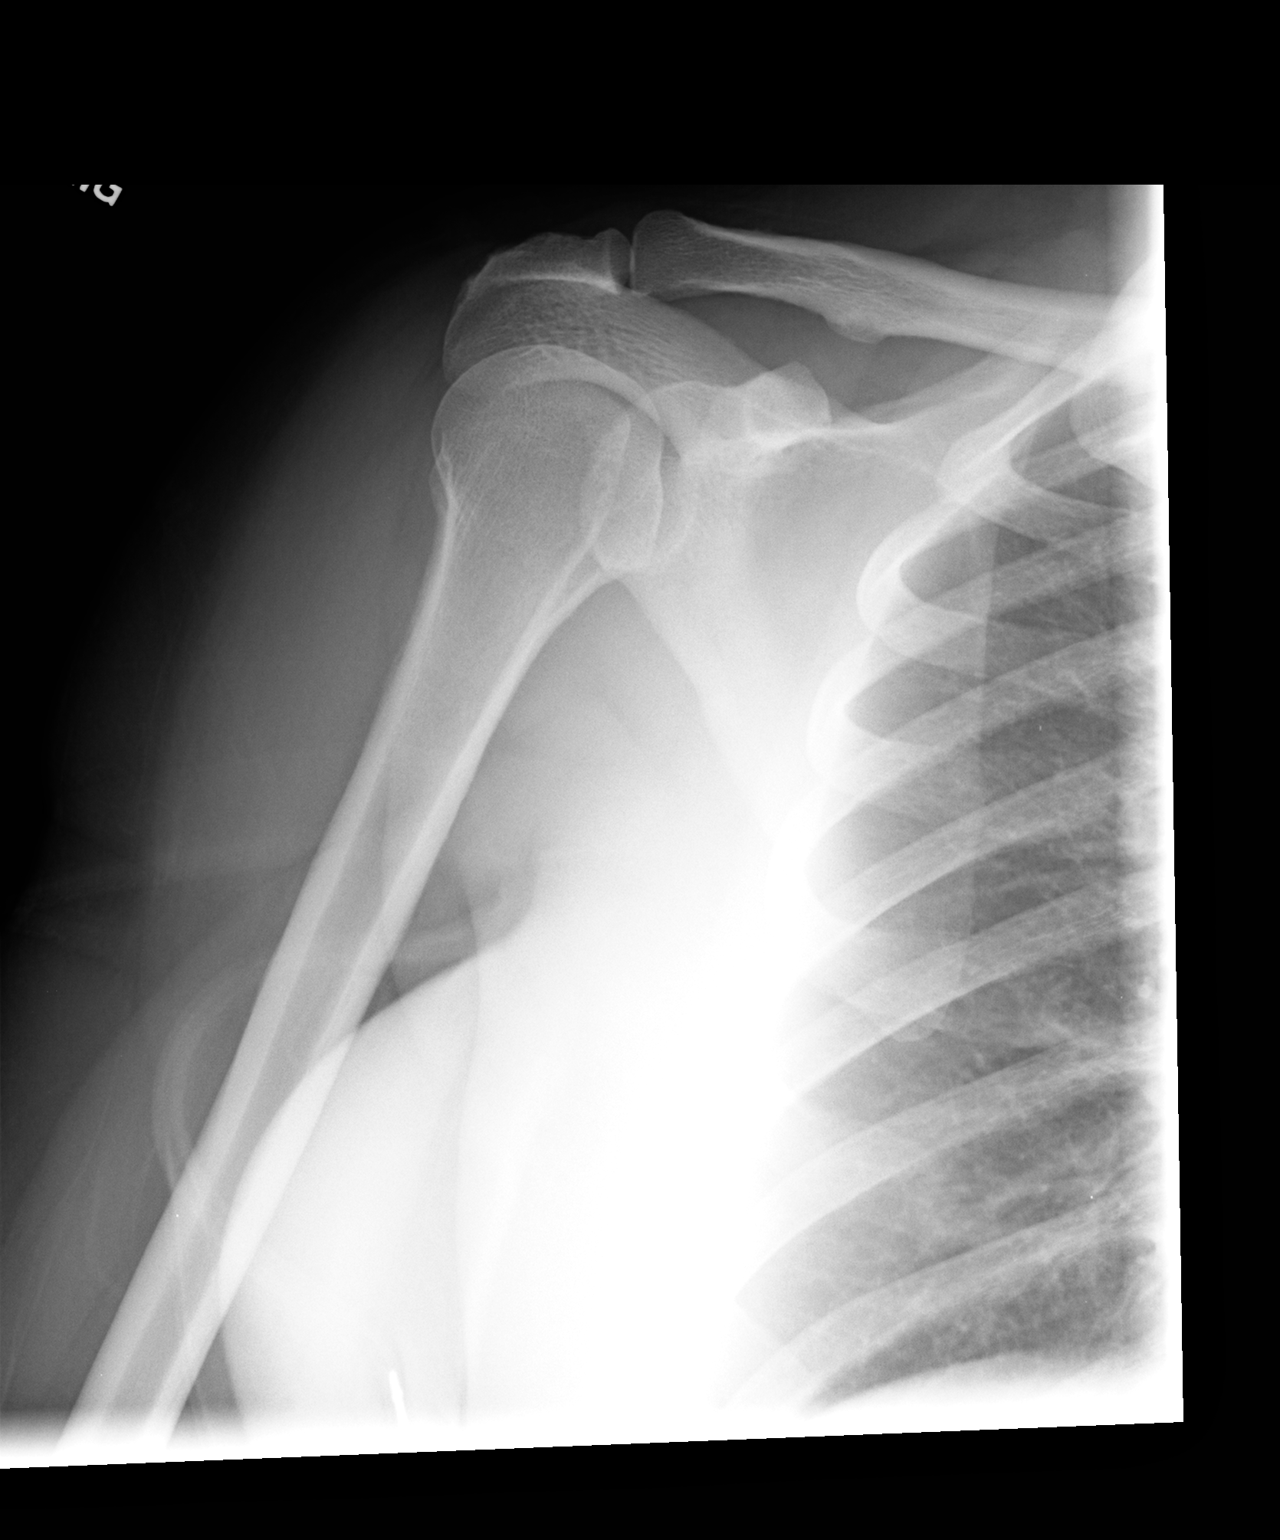

[view not recorded (2 of 3)]
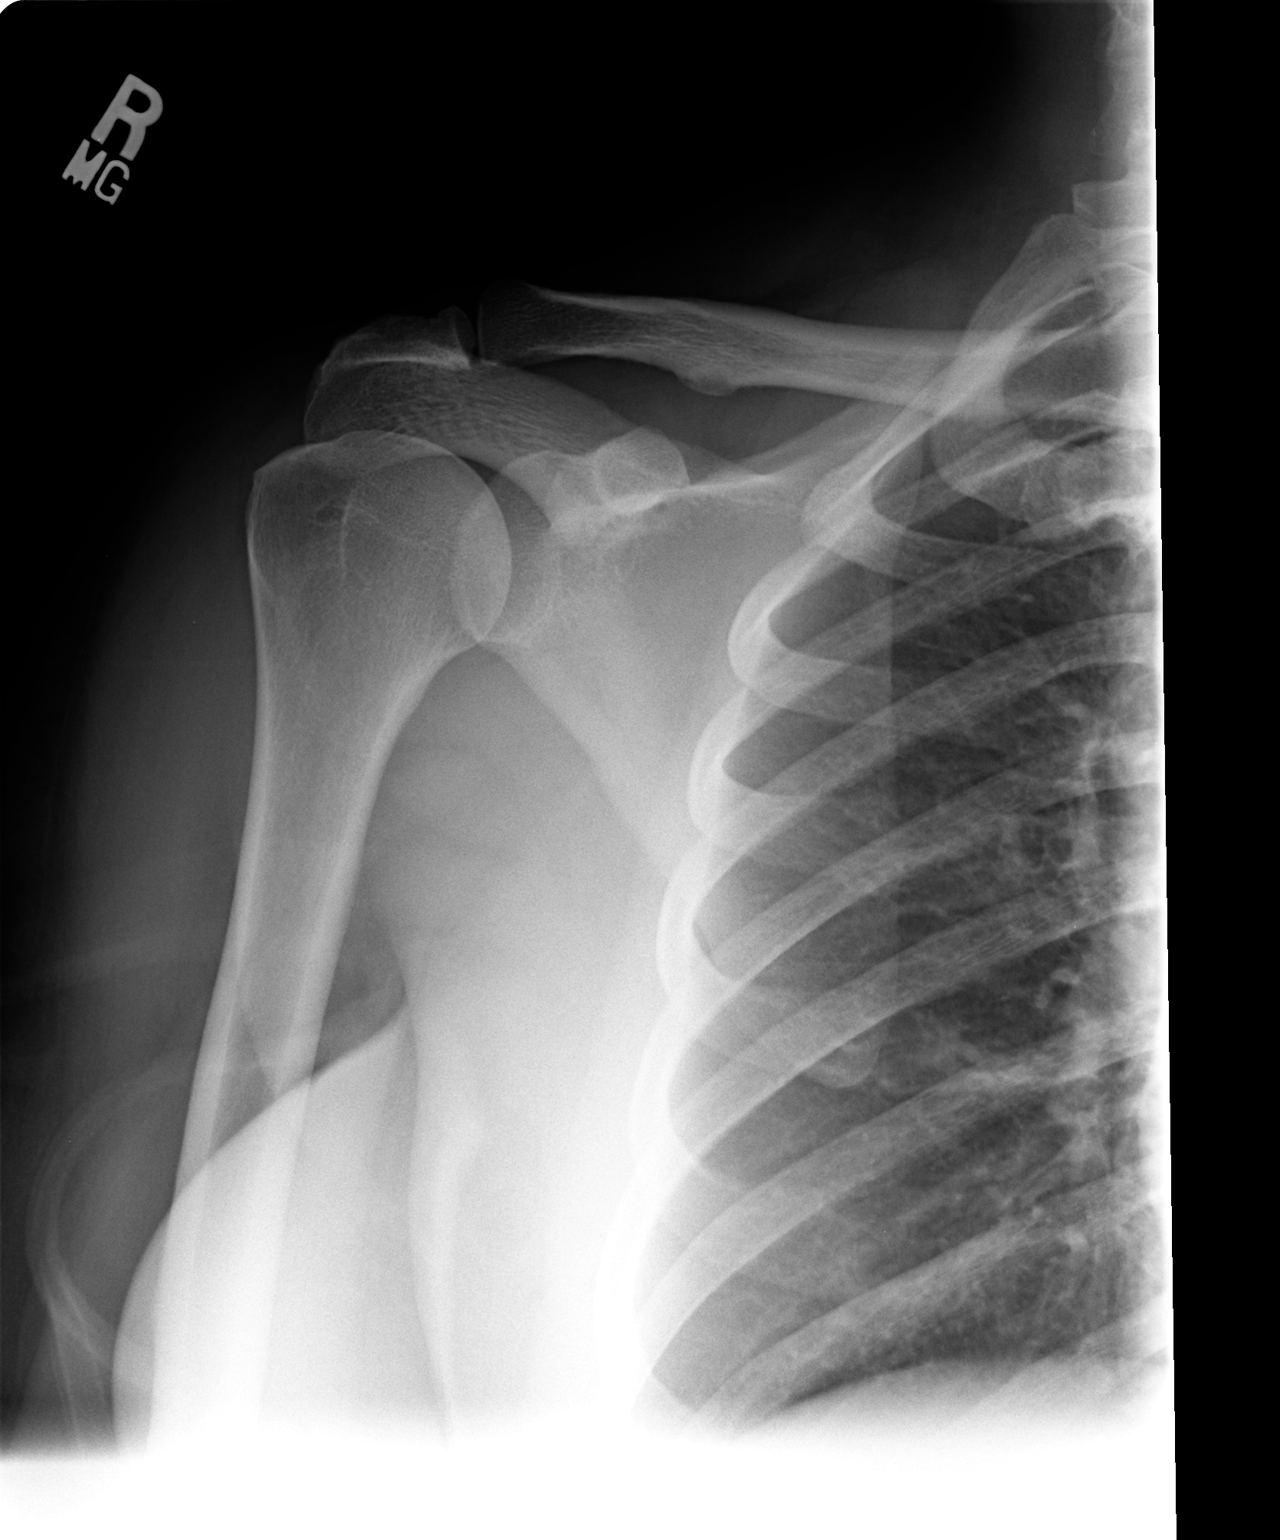

[view not recorded (3 of 3)]
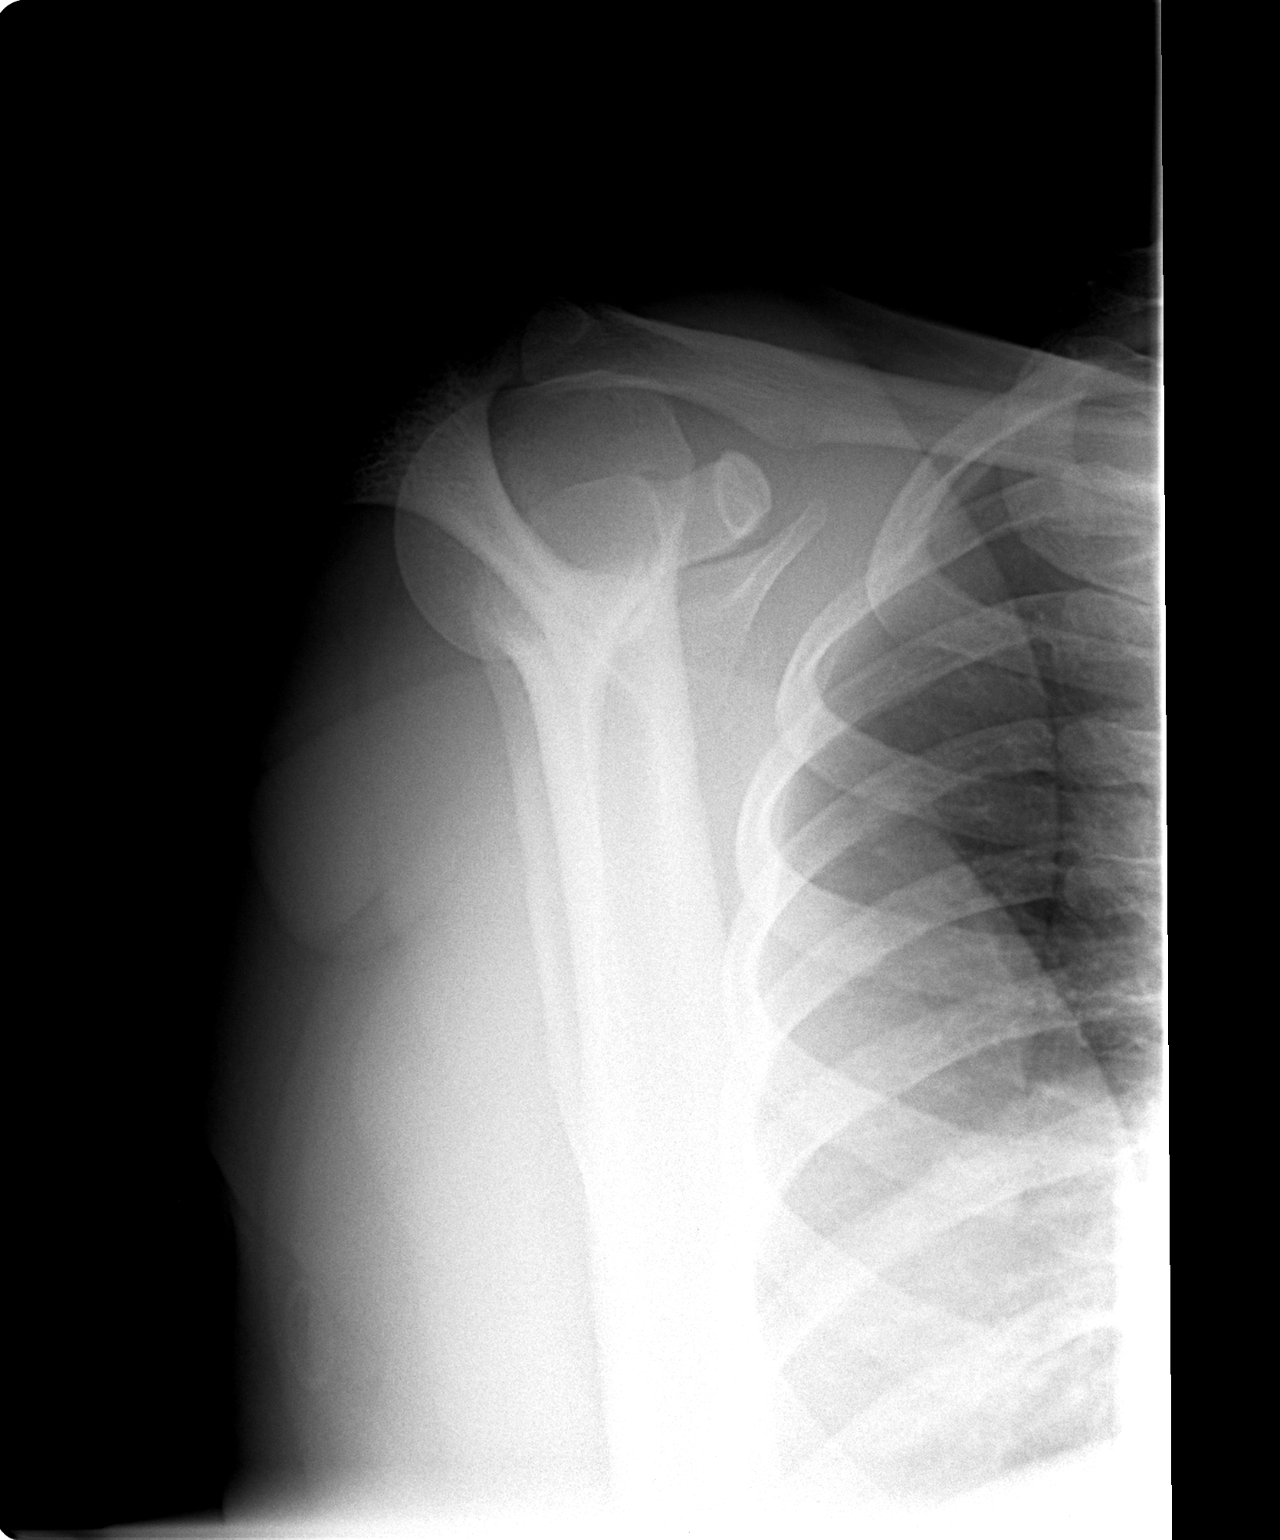

[3 of 3 positions shown; findings below may reference images not displayed]

FINDINGS: No fracture or dislocation.  Mild acromioclavicular joint
degenerative changes.  Visualized lungs clear.
IMPRESSION: No fracture or dislocation.

Mild acromioclavicular joint degenerative changes.

## 2013-09-26 ENCOUNTER — Emergency Department (HOSPITAL_COMMUNITY)
Admission: EM | Admit: 2013-09-26 | Discharge: 2013-09-26 | Disposition: A | Discharge status: Home / Self Care | Payer: 59 | Attending: Emergency Medicine | Admitting: Emergency Medicine

## 2013-09-26 ENCOUNTER — Encounter (HOSPITAL_COMMUNITY): Payer: Self-pay | Admitting: Emergency Medicine

## 2013-09-26 ENCOUNTER — Emergency Department (HOSPITAL_COMMUNITY): Payer: 59

## 2013-09-26 DIAGNOSIS — F17200 Nicotine dependence, unspecified, uncomplicated: Secondary | ICD-10-CM | POA: Insufficient documentation

## 2013-09-26 DIAGNOSIS — Z8719 Personal history of other diseases of the digestive system: Secondary | ICD-10-CM | POA: Insufficient documentation

## 2013-09-26 DIAGNOSIS — Z975 Presence of (intrauterine) contraceptive device: Secondary | ICD-10-CM | POA: Insufficient documentation

## 2013-09-26 DIAGNOSIS — M129 Arthropathy, unspecified: Secondary | ICD-10-CM | POA: Insufficient documentation

## 2013-09-26 DIAGNOSIS — J209 Acute bronchitis, unspecified: Secondary | ICD-10-CM

## 2013-09-26 DIAGNOSIS — J45901 Unspecified asthma with (acute) exacerbation: Secondary | ICD-10-CM | POA: Insufficient documentation

## 2013-09-26 HISTORY — DX: Unspecified asthma, uncomplicated: J45.909

## 2013-09-26 LAB — CBC WITH DIFFERENTIAL/PLATELET
Basophils Absolute: 0 10*3/uL (ref 0.0–0.1)
Basophils Relative: 0 % (ref 0–1)
Eosinophils Absolute: 0.1 10*3/uL (ref 0.0–0.7)
Eosinophils Relative: 2 % (ref 0–5)
HEMATOCRIT: 40.7 % (ref 36.0–46.0)
HEMOGLOBIN: 14.1 g/dL (ref 12.0–15.0)
LYMPHS ABS: 2.1 10*3/uL (ref 0.7–4.0)
LYMPHS PCT: 25 % (ref 12–46)
MCH: 29.7 pg (ref 26.0–34.0)
MCHC: 34.6 g/dL (ref 30.0–36.0)
MCV: 85.7 fL (ref 78.0–100.0)
MONO ABS: 0.6 10*3/uL (ref 0.1–1.0)
MONOS PCT: 7 % (ref 3–12)
NEUTROS ABS: 5.8 10*3/uL (ref 1.7–7.7)
Neutrophils Relative %: 66 % (ref 43–77)
Platelets: 279 10*3/uL (ref 150–400)
RBC: 4.75 MIL/uL (ref 3.87–5.11)
RDW: 14.6 % (ref 11.5–15.5)
WBC: 8.7 10*3/uL (ref 4.0–10.5)

## 2013-09-26 LAB — I-STAT VENOUS BLOOD GAS, ED
ACID-BASE DEFICIT: 3 mmol/L — AB (ref 0.0–2.0)
Bicarbonate: 21.8 mEq/L (ref 20.0–24.0)
O2 Saturation: 51 %
PO2 VEN: 28 mmHg — AB (ref 30.0–45.0)
TCO2: 23 mmol/L (ref 0–100)
pCO2, Ven: 37.1 mmHg — ABNORMAL LOW (ref 45.0–50.0)
pH, Ven: 7.376 — ABNORMAL HIGH (ref 7.250–7.300)

## 2013-09-26 LAB — BASIC METABOLIC PANEL
BUN: 9 mg/dL (ref 6–23)
CHLORIDE: 102 meq/L (ref 96–112)
CO2: 21 meq/L (ref 19–32)
CREATININE: 0.91 mg/dL (ref 0.50–1.10)
Calcium: 9 mg/dL (ref 8.4–10.5)
GFR calc Af Amer: >90 mL/min (ref >90)
GFR calc non Af Amer: 79 mL/min — ABNORMAL LOW (ref >90)
GLUCOSE: 124 mg/dL — AB (ref 70–99)
POTASSIUM: 3.5 meq/L — AB (ref 3.7–5.3)
Sodium: 138 mEq/L (ref 137–147)

## 2013-09-26 LAB — I-STAT CHEM 8, ED
BUN: 7 mg/dL (ref 6–23)
CHLORIDE: 104 meq/L (ref 96–112)
CREATININE: 0.9 mg/dL (ref 0.50–1.10)
Calcium, Ion: 1.14 mmol/L (ref 1.12–1.23)
Glucose, Bld: 121 mg/dL — ABNORMAL HIGH (ref 70–99)
HCT: 49 % — ABNORMAL HIGH (ref 36.0–46.0)
Hemoglobin: 16.7 g/dL — ABNORMAL HIGH (ref 12.0–15.0)
Potassium: 3.4 mEq/L — ABNORMAL LOW (ref 3.7–5.3)
SODIUM: 140 meq/L (ref 137–147)
TCO2: 24 mmol/L (ref 0–100)

## 2013-09-26 LAB — I-STAT TROPONIN, ED: Troponin i, poc: 0 ng/mL (ref 0.00–0.08)

## 2013-09-26 LAB — LACTIC ACID, PLASMA: Lactic Acid, Venous: 2.1 mmol/L (ref 0.5–2.2)

## 2013-09-26 LAB — CBG MONITORING, ED: Glucose-Capillary: 104 mg/dL — ABNORMAL HIGH (ref 70–99)

## 2013-09-26 MED ORDER — PREDNISONE 20 MG PO TABS
ORAL_TABLET | ORAL | Status: DC
Start: 2013-09-26 — End: 2014-01-23

## 2013-09-26 MED ORDER — ALBUTEROL SULFATE (2.5 MG/3ML) 0.083% IN NEBU
5.0000 mg | INHALATION_SOLUTION | Freq: Once | RESPIRATORY_TRACT | Status: AC
  Administered 2013-09-26: 5 mg via RESPIRATORY_TRACT
  Filled 2013-09-26: qty 6

## 2013-09-26 MED ORDER — HYDROCOD POLST-CHLORPHEN POLST 10-8 MG/5ML PO LQCR: 5.0000 mL | Freq: Two times a day (BID) | ORAL | Status: DC | PRN

## 2013-09-26 MED ORDER — METHYLPREDNISOLONE SODIUM SUCC 125 MG IJ SOLR
125.0000 mg | Freq: Once | INTRAMUSCULAR | Status: AC
  Administered 2013-09-26: 125 mg via INTRAVENOUS
  Filled 2013-09-26: qty 2

## 2013-09-26 MED ORDER — ALBUTEROL SULFATE HFA 108 (90 BASE) MCG/ACT IN AERS: 1.0000 | INHALATION_SPRAY | Freq: Four times a day (QID) | RESPIRATORY_TRACT | Status: AC | PRN

## 2013-09-26 MED ORDER — IPRATROPIUM BROMIDE 0.02 % IN SOLN
0.5000 mg | Freq: Once | RESPIRATORY_TRACT | Status: AC
  Administered 2013-09-26: 0.5 mg via RESPIRATORY_TRACT
  Filled 2013-09-26: qty 2.5

## 2013-09-26 NOTE — Progress Notes (Signed)
Peak flow post neb treatment: 250, pre treatment: 300. Pt. Was explained the importance of using peak flow & how to read it as well.

## 2013-09-26 NOTE — ED Notes (Signed)
Pt. reports asthma attack with wheezing and dry cough onset today , denies fever or chills.

## 2013-09-26 NOTE — ED Provider Notes (Signed)
CSN: [id number]     Arrival date & time 09/26/13  0100 History   First MD Initiated Contact with Patient 09/26/13 [phone]     Chief Complaint  Patient presents with  . Asthma     (Consider location/radiation/quality/duration/timing/severity/associated sxs/prior Treatment) HPI  Traci Schwartz is a very pleasant 38 yo woman with a remote history of asthma. She says she has not had an asthma attack or used an inhaler in 20 years.   She drove herself here from work with wheezing and SOB. The patient has had about 24 hours of increasingly severe wheezing with a mildly productive cough. No fever. She has centrally located chest tightness which is nonradiating. Improved after Albuterol SVNs.   Patient reports preceding URI symptoms which she has attributed to seasonal allergies. 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   Past Medical History  Diagnosis Date  . IUD (intrauterine device) in place     HAS OCCAS MENSES  . GERD (gastroesophageal reflux disease)     OCCAS- NO MEDS  . Arthritis   . Pain     RIGHT SHOULDER  . Complication of anesthesia     ANESTHESIA AWARENESS DURING BOTH C-SECTIONS; SEVERE CLAUSTROPHOBIA  . Asthma    Past Surgical History  Procedure Laterality Date  . C-sections x 2    . Shoulder arthroscopy  04/28/2012    Procedure: ARTHROSCOPY SHOULDER;  Surgeon: Johnn Hai, MD;  Location: [facility];  Service: Orthopedics;  Laterality: Right;  Right Shoulder Arthroscopy with Debridement, Evaluation under Anesthesia, Manipulation Under Anesthesia and Possible Mini Rotator Cuff Repair and Distal Clavicle Resection  . Shoulder open rotator cuff repair  04/28/2012    Procedure: ROTATOR CUFF REPAIR SHOULDER OPEN;  Surgeon: Johnn Hai, MD;  Location: [facility];  Service: Orthopedics;  Laterality: Right;   No family history on file. History  Substance Use Topics  . Smoking status: Current Every Day  Smoker -- 0.25 packs/day for 3 years    Types: Cigarettes  . Smokeless tobacco: Never Used  . Alcohol Use: Yes     Comment: RARELY   OB History   Grav Para Term Preterm Abortions TAB SAB Ect Mult Living                 Review of Systems Ten point review of symptoms performed and is negative with the exception of symptoms noted above.     Allergies  Review of patient's allergies indicates no known allergies.  Home Medications   Prior to Admission medications   Medication Sig Start Date End Date Taking? Authorizing Provider  levonorgestrel (MIRENA) 20 MCG/24HR IUD 1 each by Intrauterine route once.   Yes Historical Provider, MD   BP 155/88  Pulse 105  Temp(Src) 98.1 F (36.7 C) (Oral)  Resp 26  SpO2 100% Physical Exam Gen: well developed and well nourished appearing Head: NCAT Eyes: PERL, EOMI Nose: no epistaixis or rhinorrhea Mouth/throat: mucosa is moist and pink Neck: supple, no stridor Lungs: Respiratory 24/min, bilateral diffuse wheezing, moving air, rhonchi or rales CV: Rapid and regular, no murmur, extremities appear well perfused.  Abd: soft, notender, nondistended Back: no ttp, no cva ttp Skin: warm and dry Ext: normal to inspection, no dependent edema Neuro: CN ii-xii grossly intact, no focal deficits Psyche; anxious  affect,  calm and cooperative.   ED Course  Procedures (including critical care time) Labs Review  Results for orders  placed during the hospital encounter of 09/26/13 (from the past 24 hour(s))  CBC WITH DIFFERENTIAL     Status: None   Collection Time    09/26/13  5:30 AM      Result Value Ref Range   WBC 8.7  4.0 - 10.5 K/uL   RBC 4.75  3.87 - 5.11 MIL/uL   Hemoglobin 14.1  12.0 - 15.0 g/dL   HCT 40.7  36.0 - 46.0 %   MCV 85.7  78.0 - 100.0 fL   MCH 29.7  26.0 - 34.0 pg   MCHC 34.6  30.0 - 36.0 g/dL   RDW 14.6  11.5 - 15.5 %   Platelets 279  150 - 400 K/uL   Neutrophils Relative % 66  43 - 77 %   Neutro Abs 5.8  1.7 - 7.7 K/uL    Lymphocytes Relative 25  12 - 46 %   Lymphs Abs 2.1  0.7 - 4.0 K/uL   Monocytes Relative 7  3 - 12 %   Monocytes Absolute 0.6  0.1 - 1.0 K/uL   Eosinophils Relative 2  0 - 5 %   Eosinophils Absolute 0.1  0.0 - 0.7 K/uL   Basophils Relative 0  0 - 1 %   Basophils Absolute 0.0  0.0 - 0.1 K/uL  BASIC METABOLIC PANEL     Status: Abnormal   Collection Time    09/26/13  5:30 AM      Result Value Ref Range   Sodium 138  137 - 147 mEq/L   Potassium 3.5 (*) 3.7 - 5.3 mEq/L   Chloride 102  96 - 112 mEq/L   CO2 21  19 - 32 mEq/L   Glucose, Bld 124 (*) 70 - 99 mg/dL   BUN 9  6 - 23 mg/dL   Creatinine, Ser 0.91  0.50 - 1.10 mg/dL   Calcium 9.0  8.4 - 10.5 mg/dL   GFR calc non Af Amer 79 (*) >90 mL/min   GFR calc Af Amer >90  >90 mL/min  LACTIC ACID, PLASMA     Status: None   Collection Time    09/26/13  5:30 AM      Result Value Ref Range   Lactic Acid, Venous 2.1  0.5 - 2.2 mmol/L  I-STAT TROPOININ, ED     Status: None   Collection Time    09/26/13  5:36 AM      Result Value Ref Range   Troponin i, poc 0.00  0.00 - 0.08 ng/mL   Comment 3           I-STAT VENOUS BLOOD GAS, ED     Status: Abnormal   Collection Time    09/26/13  5:37 AM      Result Value Ref Range   pH, Ven 7.376 (*) 7.250 - 7.300   pCO2, Ven 37.1 (*) 45.0 - 50.0 mmHg   pO2, Ven 28.0 (*) 30.0 - 45.0 mmHg   Bicarbonate 21.8  20.0 - 24.0 mEq/L   TCO2 23  0 - 100 mmol/L   O2 Saturation 51.0     Acid-base deficit 3.0 (*) 0.0 - 2.0 mmol/L   Sample type VENOUS    I-STAT CHEM 8, ED     Status: Abnormal   Collection Time    09/26/13  5:38 AM      Result Value Ref Range   Sodium 140  137 - 147 mEq/L   Potassium 3.4 (*) 3.7 - 5.3 mEq/L   Chloride  104  96 - 112 mEq/L   BUN 7  6 - 23 mg/dL   Creatinine, Ser 0.90  0.50 - 1.10 mg/dL   Glucose, Bld 121 (*) 70 - 99 mg/dL   Calcium, Ion 1.14  1.12 - 1.23 mmol/L   TCO2 24  0 - 100 mmol/L   Hemoglobin 16.7 (*) 12.0 - 15.0 g/dL   HCT 49.0 (*) 36.0 - 46.0 %  CBG MONITORING,  ED     Status: Abnormal   Collection Time    09/26/13  6:05 AM      Result Value Ref Range   Glucose-Capillary 104 (*) 70 - 99 mg/dL   Comment 1 Notify RN       MDM    Patient re-evaluated at 0615: wheezing has improved but lingers - particularly in the bases and left > right. CXR and labs reassuring. We will obtain peak flows and treat with more albuterol SVN. She has received solumedrol 125mg  IV.   Elyn Peers, MD 09/30/13 2045

## 2013-09-26 NOTE — Discharge Instructions (Signed)
Bronchitis °Bronchitis is swelling (inflammation) of the air tubes leading to your lungs (bronchi). This causes mucus and a cough. If the swelling gets bad, you may have trouble breathing. °HOME CARE  °· Rest. °· Drink enough fluids to keep your pee (urine) clear or pale yellow (unless you have a condition where you have to watch how much you drink). °· Only take medicine as told by your doctor. If you were given antibiotic medicines, finish them even if you start to feel better. °· Avoid smoke, irritating chemicals, and strong smells. These make the problem worse. Quit smoking if you smoke. This helps your lungs heal faster. °· Use a cool mist humidifier. Change the water in the humidifier every day. You can also sit in the bathroom with hot shower running for 5 10 minutes. Keep the door closed. °· See your health care provider as told. °· Wash your hands often. °GET HELP IF: °Your problems do not get better after 1 week. °GET HELP RIGHT AWAY IF:  °· Your fever gets worse. °· You have chills. °· Your chest hurts. °· Your problems breathing get worse. °· You have blood in your mucus. °· You pass out (faint). °· You feel lightheaded. °· You have a bad headache. °· You throw up (vomit) again and again. °MAKE SURE YOU: °· Understand these instructions. °· Will watch your condition. °· Will get help right away if you are not doing well or get worse. °Document Released: 10/15/2007 Document Revised: 02/16/2013 Document Reviewed: 12/21/2012 °ExitCare® Patient Information ©2014 [employer]. ° °

## 2013-09-26 NOTE — ED Notes (Signed)
CBG checked 104

## 2013-10-02 LAB — CULTURE, BLOOD (ROUTINE X 2)
CULTURE: NO GROWTH
Culture: NO GROWTH

## 2014-01-20 ENCOUNTER — Encounter (HOSPITAL_COMMUNITY): Payer: Self-pay

## 2014-01-26 ENCOUNTER — Encounter (HOSPITAL_COMMUNITY)
Admission: RE | Admit: 2014-01-26 | Discharge: 2014-01-26 | Disposition: A | Discharge status: Home / Self Care | Payer: 59 | Source: Ambulatory Visit | Attending: Obstetrics and Gynecology | Admitting: Obstetrics and Gynecology

## 2014-01-26 ENCOUNTER — Encounter (HOSPITAL_COMMUNITY): Payer: Self-pay

## 2014-01-26 DIAGNOSIS — Z01812 Encounter for preprocedural laboratory examination: Secondary | ICD-10-CM | POA: Insufficient documentation

## 2014-01-26 DIAGNOSIS — Z30432 Encounter for removal of intrauterine contraceptive device: Secondary | ICD-10-CM | POA: Insufficient documentation

## 2014-01-26 LAB — CBC
HCT: 40.1 % (ref 36.0–46.0)
Hemoglobin: 13.5 g/dL (ref 12.0–15.0)
MCH: 29.5 pg (ref 26.0–34.0)
MCHC: 33.7 g/dL (ref 30.0–36.0)
MCV: 87.6 fL (ref 78.0–100.0)
Platelets: 316 10*3/uL (ref 150–400)
RBC: 4.58 MIL/uL (ref 3.87–5.11)
RDW: 14.6 % (ref 11.5–15.5)
WBC: 8.4 10*3/uL (ref 4.0–10.5)

## 2014-01-26 NOTE — Patient Instructions (Signed)
Your procedure is scheduled on:02/03/14  Enter through the Main Entrance at : 11 am Pick up desk phone and dial [phone] and inform us of your arrival.  Please call [phone] if you have any problems the morning of surgery.  Remember: Do not eat food or drink liquids, including water, after midnight:Thursday Clear liquids are ok until:0830 am on 02/03/14   You may brush your teeth the morning of surgery.  Bring inhaler to hospital on day of surgery.  DO NOT wear jewelry, eye make-up, lipstick,body lotion, or dark fingernail polish.  (Polished toes are ok) You may wear deodorant.  If you are to be admitted after surgery, leave suitcase in car until your room has been assigned. Patients discharged on the day of surgery will not be allowed to drive home. Wear loose fitting, comfortable clothes for your ride home.

## 2014-02-03 ENCOUNTER — Encounter (HOSPITAL_COMMUNITY): Payer: 59 | Admitting: Registered Nurse

## 2014-02-03 ENCOUNTER — Encounter (HOSPITAL_COMMUNITY): Payer: Self-pay

## 2014-02-03 ENCOUNTER — Ambulatory Visit (HOSPITAL_COMMUNITY)
Admission: RE | Admit: 2014-02-03 | Discharge: 2014-02-03 | Disposition: A | Discharge status: Home / Self Care | Payer: 59 | Source: Ambulatory Visit | Attending: Obstetrics and Gynecology | Admitting: Obstetrics and Gynecology

## 2014-02-03 ENCOUNTER — Encounter (HOSPITAL_COMMUNITY)
Admission: RE | Disposition: A | Discharge status: Home / Self Care | Payer: Self-pay | Source: Ambulatory Visit | Attending: Obstetrics and Gynecology

## 2014-02-03 ENCOUNTER — Ambulatory Visit (HOSPITAL_COMMUNITY): Payer: 59 | Admitting: Registered Nurse

## 2014-02-03 DIAGNOSIS — F17200 Nicotine dependence, unspecified, uncomplicated: Secondary | ICD-10-CM | POA: Diagnosis not present

## 2014-02-03 DIAGNOSIS — K219 Gastro-esophageal reflux disease without esophagitis: Secondary | ICD-10-CM | POA: Diagnosis not present

## 2014-02-03 DIAGNOSIS — Z30432 Encounter for removal of intrauterine contraceptive device: Secondary | ICD-10-CM | POA: Diagnosis present

## 2014-02-03 HISTORY — DX: Nausea with vomiting, unspecified: R11.2

## 2014-02-03 HISTORY — DX: Other specified postprocedural states: Z98.890

## 2014-02-03 HISTORY — PX: HYSTEROSCOPY: SHX211

## 2014-02-03 LAB — PREGNANCY, URINE: Preg Test, Ur: NEGATIVE

## 2014-02-03 SURGERY — HYSTEROSCOPY
Anesthesia: Choice | Site: Uterus

## 2014-02-03 MED ORDER — LACTATED RINGERS IV SOLN: INTRAVENOUS | Status: DC

## 2014-02-03 MED ORDER — KETOROLAC TROMETHAMINE 30 MG/ML IJ SOLN
INTRAMUSCULAR | Status: DC | PRN
  Administered 2014-02-03: 30 mg via INTRAVENOUS

## 2014-02-03 MED ORDER — SCOPOLAMINE 1 MG/3DAYS TD PT72
MEDICATED_PATCH | TRANSDERMAL | Status: AC
  Administered 2014-02-03: 1.5 mg via TRANSDERMAL
  Filled 2014-02-03: qty 1

## 2014-02-03 MED ORDER — KETOROLAC TROMETHAMINE 30 MG/ML IJ SOLN
INTRAMUSCULAR | Status: AC
  Filled 2014-02-03: qty 1

## 2014-02-03 MED ORDER — FENTANYL CITRATE 0.05 MG/ML IJ SOLN
INTRAMUSCULAR | Status: DC | PRN
  Administered 2014-02-03 (×2): 50 ug via INTRAVENOUS

## 2014-02-03 MED ORDER — PROPOFOL 10 MG/ML IV BOLUS
INTRAVENOUS | Status: DC | PRN
  Administered 2014-02-03: 200 mg via INTRAVENOUS

## 2014-02-03 MED ORDER — MIDAZOLAM HCL 5 MG/5ML IJ SOLN
INTRAMUSCULAR | Status: DC | PRN
  Administered 2014-02-03: 2 mg via INTRAVENOUS

## 2014-02-03 MED ORDER — FENTANYL CITRATE 0.05 MG/ML IJ SOLN
25.0000 ug | INTRAMUSCULAR | Status: DC | PRN
  Administered 2014-02-03 (×2): 50 ug via INTRAVENOUS

## 2014-02-03 MED ORDER — LIDOCAINE HCL 1 % IJ SOLN
INTRAMUSCULAR | Status: DC | PRN
  Administered 2014-02-03: 10 mL

## 2014-02-03 MED ORDER — DEXAMETHASONE SODIUM PHOSPHATE 4 MG/ML IJ SOLN
INTRAMUSCULAR | Status: AC
  Filled 2014-02-03: qty 1

## 2014-02-03 MED ORDER — ACETAMINOPHEN 160 MG/5ML PO SOLN
975.0000 mg | Freq: Four times a day (QID) | ORAL | Status: DC | PRN
  Administered 2014-02-03: 975 mg via ORAL

## 2014-02-03 MED ORDER — GLYCINE 1.5 % IR SOLN
Status: DC | PRN
  Administered 2014-02-03: 3000 mL

## 2014-02-03 MED ORDER — DEXAMETHASONE SODIUM PHOSPHATE 4 MG/ML IJ SOLN
INTRAMUSCULAR | Status: DC | PRN
  Administered 2014-02-03: 4 mg via INTRAVENOUS

## 2014-02-03 MED ORDER — PROPOFOL 10 MG/ML IV EMUL
INTRAVENOUS | Status: AC
  Filled 2014-02-03: qty 20

## 2014-02-03 MED ORDER — ACETAMINOPHEN 160 MG/5ML PO SOLN
ORAL | Status: AC
  Administered 2014-02-03: 975 mg via ORAL
  Filled 2014-02-03: qty 40.6

## 2014-02-03 MED ORDER — PROMETHAZINE HCL 25 MG/ML IJ SOLN
6.2500 mg | INTRAMUSCULAR | Status: DC | PRN
  Administered 2014-02-03: 6.25 mg via INTRAVENOUS

## 2014-02-03 MED ORDER — LIDOCAINE HCL (CARDIAC) 20 MG/ML IV SOLN
INTRAVENOUS | Status: DC | PRN
  Administered 2014-02-03: 50 mg via INTRAVENOUS

## 2014-02-03 MED ORDER — PROMETHAZINE HCL 25 MG/ML IJ SOLN
INTRAMUSCULAR | Status: AC
  Administered 2014-02-03: 6.25 mg via INTRAVENOUS
  Filled 2014-02-03: qty 1

## 2014-02-03 MED ORDER — ONDANSETRON HCL 4 MG/2ML IJ SOLN
INTRAMUSCULAR | Status: AC
  Filled 2014-02-03: qty 2

## 2014-02-03 MED ORDER — SCOPOLAMINE 1 MG/3DAYS TD PT72
1.0000 | MEDICATED_PATCH | Freq: Once | TRANSDERMAL | Status: DC
  Administered 2014-02-03: 1.5 mg via TRANSDERMAL

## 2014-02-03 MED ORDER — LIDOCAINE HCL 1 % IJ SOLN
INTRAMUSCULAR | Status: AC
  Filled 2014-02-03: qty 20

## 2014-02-03 MED ORDER — MIDAZOLAM HCL 2 MG/2ML IJ SOLN
INTRAMUSCULAR | Status: AC
  Filled 2014-02-03: qty 2

## 2014-02-03 MED ORDER — FENTANYL CITRATE 0.05 MG/ML IJ SOLN
INTRAMUSCULAR | Status: AC
  Administered 2014-02-03: 50 ug via INTRAVENOUS
  Filled 2014-02-03: qty 2

## 2014-02-03 MED ORDER — ONDANSETRON HCL 4 MG/2ML IJ SOLN
INTRAMUSCULAR | Status: DC | PRN
  Administered 2014-02-03: 4 mg via INTRAVENOUS

## 2014-02-03 MED ORDER — LIDOCAINE HCL (CARDIAC) 20 MG/ML IV SOLN
INTRAVENOUS | Status: AC
  Filled 2014-02-03: qty 5

## 2014-02-03 MED ORDER — FENTANYL CITRATE 0.05 MG/ML IJ SOLN
INTRAMUSCULAR | Status: AC
  Filled 2014-02-03: qty 2

## 2014-02-03 MED ORDER — LACTATED RINGERS IV SOLN
INTRAVENOUS | Status: DC
  Administered 2014-02-03 (×2): via INTRAVENOUS

## 2014-02-03 SURGICAL SUPPLY — 20 items
CANISTER SUCT 3000ML (MISCELLANEOUS) ×3 IMPLANT
CATH ROBINSON RED A/P 16FR (CATHETERS) ×3 IMPLANT
CLOTH BEACON ORANGE TIMEOUT ST (SAFETY) ×3 IMPLANT
CONTAINER PREFILL 10% NBF 60ML (FORM) ×6 IMPLANT
DRAPE HYSTEROSCOPY (DRAPE) ×3 IMPLANT
ELECT REM PT RETURN 9FT ADLT (ELECTROSURGICAL) ×3
ELECTRODE REM PT RTRN 9FT ADLT (ELECTROSURGICAL) ×1 IMPLANT
GLOVE BIOGEL PI IND STRL 6.5 (GLOVE) ×1 IMPLANT
GLOVE BIOGEL PI INDICATOR 6.5 (GLOVE) ×2
GLOVE ECLIPSE 6.5 STRL STRAW (GLOVE) ×3 IMPLANT
GOWN STRL REUS W/TWL LRG LVL3 (GOWN DISPOSABLE) ×6 IMPLANT
LOOP ANGLED CUTTING 22FR (CUTTING LOOP) IMPLANT
NEEDLE SPNL 22GX3.5 QUINCKE BK (NEEDLE) ×3 IMPLANT
PACK VAGINAL MINOR WOMEN LF (CUSTOM PROCEDURE TRAY) ×3 IMPLANT
PAD OB MATERNITY 4.3X12.25 (PERSONAL CARE ITEMS) ×3 IMPLANT
SET TUBING HYSTEROSCOPY 2 NDL (TUBING) IMPLANT
SYR CONTROL 10ML LL (SYRINGE) ×3 IMPLANT
TOWEL OR 17X24 6PK STRL BLUE (TOWEL DISPOSABLE) ×6 IMPLANT
TUBE HYSTEROSCOPY W Y-CONNECT (TUBING) IMPLANT
WATER STERILE IRR 1000ML POUR (IV SOLUTION) ×3 IMPLANT

## 2014-02-03 NOTE — H&P (Signed)
38 y.o.  complains of wanting IUD removed.  Attempt was made in office without success, no visible strings, and patient too uncomfortable to dilate and use uterine forceps.  She requested to have it removed with hysteroscopy.  Past Medical History  Diagnosis Date  . IUD (intrauterine device) in place     HAS OCCAS MENSES  . GERD (gastroesophageal reflux disease)     OCCAS- NO MEDS  . Arthritis   . Pain     RIGHT SHOULDER  . Complication of anesthesia     ANESTHESIA AWARENESS DURING BOTH C-SECTIONS; SEVERE CLAUSTROPHOBIA  . Asthma   . PONV (postoperative nausea and vomiting)    Past Surgical History  Procedure Laterality Date  . C-sections x 2    . Shoulder arthroscopy  04/28/2012    Procedure: ARTHROSCOPY SHOULDER;  Surgeon: Johnn Hai, MD;  Location: [facility];  Service: Orthopedics;  Laterality: Right;  Right Shoulder Arthroscopy with Debridement, Evaluation under Anesthesia, Manipulation Under Anesthesia and Possible Mini Rotator Cuff Repair and Distal Clavicle Resection  . Shoulder open rotator cuff repair  04/28/2012    Procedure: ROTATOR CUFF REPAIR SHOULDER OPEN;  Surgeon: Johnn Hai, MD;  Location: [facility];  Service: Orthopedics;  Laterality: Right;    History   Social History  . Marital Status: Legally Separated    Spouse Name: N/A    Number of Children: N/A  . Years of Education: N/A   Occupational History  . Not on file.   Social History Main Topics  . Smoking status: Current Every Day Smoker -- 0.25 packs/day for 3 years    Types: Cigarettes  . Smokeless tobacco: Never Used  . Alcohol Use: Yes     Comment: RARELY  . Drug Use: No  . Sexual Activity: Not Currently    Birth Control/ Protection: IUD   Other Topics Concern  . Not on file   Social History Narrative  . No narrative on file    No current facility-administered medications on file prior to encounter.   Current Outpatient Prescriptions on File Prior to Encounter  Medication Sig Dispense  Refill  . albuterol (PROVENTIL HFA;VENTOLIN HFA) 108 (90 BASE) MCG/ACT inhaler Inhale 1-2 puffs into the lungs every 6 (six) hours as needed for wheezing or shortness of breath.  1 Inhaler  0    No Known Allergies  @VITALS2 @  Lungs: clear to ascultation Cor:  RRR Abdomen:  soft, nontender, nondistended. Ex:  no cords, erythema Pelvic:  Def to OR  A:  Admit for desired IUD removal   P:  Hysteroscopic IUD removal.   All risks, benefits and alternatives d/w patient and she desires to proceed.    Allyn Kenner

## 2014-02-03 NOTE — Transfer of Care (Signed)
Immediate Anesthesia Transfer of Care Note  Patient: Traci Schwartz  Procedure(s) Performed: Procedure(s) with comments: HYSTEROSCOPY (N/A) - rEMOVAL OF MIREBA IUD AND RE INSERTION OF NEW MIRENA  Patient Location: PACU  Anesthesia Type:General  Level of Consciousness: awake, alert  and oriented  Airway & Oxygen Therapy: Patient Spontanous Breathing and Patient connected to nasal cannula oxygen  Post-op Assessment: Report given to PACU RN  Post vital signs: Reviewed  Complications: No apparent anesthesia complications

## 2014-02-03 NOTE — Anesthesia Postprocedure Evaluation (Signed)
  Anesthesia Post-op Note  Patient: Traci Schwartz  Procedure(s) Performed: Procedure(s) with comments: HYSTEROSCOPY (N/A) - rEMOVAL OF MIREBA IUD AND RE INSERTION OF NEW MIRENA  Patient Location: PACU  Anesthesia Type:General  Level of Consciousness: awake, alert  and oriented  Airway and Oxygen Therapy: Patient Spontanous Breathing  Post-op Pain: none  Post-op Assessment: Post-op Vital signs reviewed, Patient's Cardiovascular Status Stable, Respiratory Function Stable, Patent Airway, No signs of Nausea or vomiting and Pain level controlled  Post-op Vital Signs: Reviewed and stable  Last Vitals:  Filed Vitals:   02/03/14 1445  BP: 124/71  Pulse: 72  Temp:   Resp: 27    Complications: No apparent anesthesia complications

## 2014-02-03 NOTE — Anesthesia Preprocedure Evaluation (Signed)
Anesthesia Evaluation  Patient identified by MRN, date of birth, ID band Patient awake    Reviewed: Allergy & Precautions, H&P , Patient's Chart, lab work & pertinent test results, reviewed documented beta blocker date and time   Airway Mallampati: II  TM Distance: >3 FB Neck ROM: full    Dental no notable dental hx.    Pulmonary Current Smoker,  breath sounds clear to auscultation  Pulmonary exam normal       Cardiovascular Rhythm:regular Rate:Normal     Neuro/Psych    GI/Hepatic   Endo/Other    Renal/GU      Musculoskeletal   Abdominal   Peds  Hematology   Anesthesia Other Findings   Reproductive/Obstetrics                            Anesthesia Physical Anesthesia Plan  ASA: II  Anesthesia Plan:    Post-op Pain Management:    Induction: Intravenous  Airway Management Planned: LMA  Additional Equipment:   Intra-op Plan:   Post-operative Plan:   Informed Consent: I have reviewed the patients History and Physical, chart, labs and discussed the procedure including the risks, benefits and alternatives for the proposed anesthesia with the patient or authorized representative who has indicated his/her understanding and acceptance.   Dental Advisory Given and Dental advisory given  Plan Discussed with: CRNA and Surgeon  Anesthesia Plan Comments: (Discussed GA with LMA, possible sore throat, potential need to switch to ETT, N/V, pulmonary aspiration. Questions answered. )        Anesthesia Quick Evaluation  

## 2014-02-03 NOTE — Discharge Instructions (Signed)
DISCHARGE INSTRUCTIONS: IUD REMOVAL  The following instructions have been prepared to help you care for yourself upon your return home.  MAY TAKE IBUPROFEN (MOTRIN, ADVIL) OR ALEVE AFTER 7:00 PM FOR CRAMPS!!!   Personal hygiene:  Use sanitary pads for vaginal drainage, not tampons.  Shower the day after your procedure.  NO tub baths, pools or Jacuzzis for 2-3 weeks.  Wipe front to back after using the bathroom.  Activity and limitations:  Do NOT drive or operate any equipment for 24 hours. The effects of anesthesia are still present and drowsiness may result.  Do NOT rest in bed all day.  Walking is encouraged.  Walk up and down stairs slowly.  You may resume your normal activity in one to two days or as indicated by your physician.  Sexual activity: NO intercourse for at least 2 weeks after the procedure, or as indicated by your physician.  Diet: Eat a light meal as desired this evening. You may resume your usual diet tomorrow.  Return to work: You may resume your work activities in one to two days or as indicated by your doctor.  What to expect after your surgery: Expect to have vaginal bleeding/discharge for 2-3 days and spotting for up to 10 days. It is not unusual to have soreness for up to 1-2 weeks. You may have a slight burning sensation when you urinate for the first day. Mild cramps may continue for a couple of days. You may have a regular period in 2-6 weeks.  Call your doctor for any of the following:  Excessive vaginal bleeding, saturating and changing one pad every hour.  Inability to urinate 6 hours after discharge from hospital.  Pain not relieved by pain medication.  Fever of 100.4 F or greater.  Unusual vaginal discharge or odor.   Call for an appointment:    Patients signature: ______________________  Nurses signature ________________________  Support person's signature_______________________

## 2014-02-04 NOTE — Op Note (Signed)
Traci Schwartz, Traci Schwartz               ACCOUNT NO.:  [id number]  MEDICAL RECORD NO.:  [id number]  LOCATION:  [facility]                          FACILITY:  [facility]  PHYSICIAN:  Allyn Kenner, DO    DATE OF BIRTH:  November 06, 1975  DATE OF PROCEDURE:  02/03/2014 DATE OF DISCHARGE:  02/03/2014                              OPERATIVE REPORT   PREOPERATIVE DIAGNOSIS:  Desired removal of IUD with no strings visible.  POSTOPERATIVE DIAGNOSIS:  Desired removal of IUD with no strings visible.  PROCEDURE:  Hysteroscopic removal of Mirena IUD.  SURGEON:  Allyn Kenner, DO.  ANESTHESIA:  MAC.  Local anesthetic; 1% lidocaine, 10 mL of paracervical block.  ESTIMATED BLOOD LOSS:  5 mL.  URINE OUTPUT:  300 mL.  Cathed preoperatively.  IV FLUIDS:  1000 mL.  SPECIMENS:  None.  FINDINGS:  Fluffy endometrium with clearly visible Mirena IUD.  No other abnormalities noted.  COMPLICATIONS:  None.  CONDITION:  Stable to PACU.  DESCRIPTION OF PROCEDURE:  The patient was taken to the operating room, where anesthesia was administered and found to be adequate.  She was prepped and draped in normal sterile fashion, dorsal lithotomy position. A speculum was placed in the vagina and the cervix visualized and grasped the anterior lip with a single-tooth tenaculum.  Uterus was sounded to approximately 9 cm and the cervix was serially dilated to 23 Pratt.  The hysteroscope was entered to the cervix to uterine cavity and the IUD was visible with its strings.  A grasper was inserted.  The strings grasped and the IUD removed without difficulty.  The patient tolerated the procedure well. Sponge, lap, and needle counts were correct x2.  All instruments were removed from the uterus, vagina.  Hemostasis was noted at the tenaculum site.  The patient was returned to supine position.  She was then brought to recovery in stable condition.          ______________________________ Allyn Kenner, DO     [place]/MEDQ  D:   02/03/2014  T:  02/04/2014  Job:  [id number]

## 2014-02-06 ENCOUNTER — Encounter (HOSPITAL_COMMUNITY): Payer: Self-pay | Admitting: Obstetrics and Gynecology

## 2014-09-04 ENCOUNTER — Other Ambulatory Visit: Payer: Self-pay | Admitting: Obstetrics and Gynecology

## 2014-09-06 ENCOUNTER — Encounter (HOSPITAL_BASED_OUTPATIENT_CLINIC_OR_DEPARTMENT_OTHER): Payer: Self-pay | Admitting: *Deleted

## 2014-09-08 ENCOUNTER — Encounter (HOSPITAL_BASED_OUTPATIENT_CLINIC_OR_DEPARTMENT_OTHER): Payer: Self-pay | Admitting: *Deleted
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
## 2014-09-11 ENCOUNTER — Ambulatory Visit (HOSPITAL_BASED_OUTPATIENT_CLINIC_OR_DEPARTMENT_OTHER)
Admission: RE | Admit: 2014-09-11 | Discharge: 2014-09-11 | Disposition: A | Discharge status: Home / Self Care | Payer: 59 | Source: Ambulatory Visit | Attending: Obstetrics and Gynecology | Admitting: Obstetrics and Gynecology

## 2014-09-11 ENCOUNTER — Ambulatory Visit (HOSPITAL_BASED_OUTPATIENT_CLINIC_OR_DEPARTMENT_OTHER): Payer: 59 | Admitting: Anesthesiology

## 2014-09-11 ENCOUNTER — Encounter (HOSPITAL_BASED_OUTPATIENT_CLINIC_OR_DEPARTMENT_OTHER): Payer: Self-pay | Admitting: *Deleted

## 2014-09-11 ENCOUNTER — Encounter (HOSPITAL_BASED_OUTPATIENT_CLINIC_OR_DEPARTMENT_OTHER)
Admission: RE | Disposition: A | Discharge status: Home / Self Care | Payer: Self-pay | Source: Ambulatory Visit | Attending: Obstetrics and Gynecology

## 2014-09-11 DIAGNOSIS — N920 Excessive and frequent menstruation with regular cycle: Secondary | ICD-10-CM | POA: Insufficient documentation

## 2014-09-11 DIAGNOSIS — M1990 Unspecified osteoarthritis, unspecified site: Secondary | ICD-10-CM | POA: Insufficient documentation

## 2014-09-11 DIAGNOSIS — D259 Leiomyoma of uterus, unspecified: Secondary | ICD-10-CM | POA: Insufficient documentation

## 2014-09-11 DIAGNOSIS — N939 Abnormal uterine and vaginal bleeding, unspecified: Secondary | ICD-10-CM

## 2014-09-11 DIAGNOSIS — Z6841 Body Mass Index (BMI) 40.0 and over, adult: Secondary | ICD-10-CM | POA: Insufficient documentation

## 2014-09-11 DIAGNOSIS — J45909 Unspecified asthma, uncomplicated: Secondary | ICD-10-CM | POA: Diagnosis not present

## 2014-09-11 DIAGNOSIS — K219 Gastro-esophageal reflux disease without esophagitis: Secondary | ICD-10-CM | POA: Insufficient documentation

## 2014-09-11 DIAGNOSIS — F17210 Nicotine dependence, cigarettes, uncomplicated: Secondary | ICD-10-CM | POA: Insufficient documentation

## 2014-09-11 DIAGNOSIS — Z79899 Other long term (current) drug therapy: Secondary | ICD-10-CM | POA: Insufficient documentation

## 2014-09-11 HISTORY — DX: Anxiety disorder, unspecified: F41.9

## 2014-09-11 HISTORY — DX: Presence of spectacles and contact lenses: Z97.3

## 2014-09-11 HISTORY — PX: IUD REMOVAL: SHX5392

## 2014-09-11 HISTORY — DX: Personal history of other (healed) physical injury and trauma: Z87.828

## 2014-09-11 HISTORY — DX: Post-traumatic stress disorder, unspecified: F43.10

## 2014-09-11 HISTORY — DX: Other allergic rhinitis: J30.89

## 2014-09-11 HISTORY — PX: HYSTEROSCOPY WITH D & C: SHX1775

## 2014-09-11 HISTORY — DX: Leiomyoma of uterus, unspecified: D25.9

## 2014-09-11 HISTORY — DX: Personal history of other specified conditions: Z87.898

## 2014-09-11 LAB — POCT PREGNANCY, URINE: Preg Test, Ur: NEGATIVE

## 2014-09-11 LAB — POCT HEMOGLOBIN-HEMACUE: Hemoglobin: 14.9 g/dL (ref 12.0–15.0)

## 2014-09-11 SURGERY — DILATATION AND CURETTAGE /HYSTEROSCOPY
Anesthesia: General | Site: Vagina

## 2014-09-11 MED ORDER — FENTANYL CITRATE (PF) 100 MCG/2ML IJ SOLN
INTRAMUSCULAR | Status: DC | PRN
  Administered 2014-09-11: 100 ug via INTRAVENOUS
  Administered 2014-09-11 (×2): 50 ug via INTRAVENOUS

## 2014-09-11 MED ORDER — DEXAMETHASONE SODIUM PHOSPHATE 4 MG/ML IJ SOLN
INTRAMUSCULAR | Status: DC | PRN
  Administered 2014-09-11: 10 mg via INTRAVENOUS

## 2014-09-11 MED ORDER — KETOROLAC TROMETHAMINE 30 MG/ML IJ SOLN
INTRAMUSCULAR | Status: DC | PRN
  Administered 2014-09-11: 30 mg via INTRAVENOUS

## 2014-09-11 MED ORDER — MIDAZOLAM HCL 2 MG/2ML IJ SOLN
INTRAMUSCULAR | Status: AC
  Filled 2014-09-11: qty 2

## 2014-09-11 MED ORDER — LACTATED RINGERS IV SOLN
INTRAVENOUS | Status: DC
  Administered 2014-09-11: 12:00:00 via INTRAVENOUS
  Filled 2014-09-11: qty 1000

## 2014-09-11 MED ORDER — MIDAZOLAM HCL 5 MG/5ML IJ SOLN
INTRAMUSCULAR | Status: DC | PRN
  Administered 2014-09-11: 2 mg via INTRAVENOUS

## 2014-09-11 MED ORDER — OXYCODONE HCL 5 MG PO TABS
ORAL_TABLET | ORAL | Status: AC
  Filled 2014-09-11: qty 1

## 2014-09-11 MED ORDER — ACETAMINOPHEN 10 MG/ML IV SOLN
INTRAVENOUS | Status: DC | PRN
  Administered 2014-09-11: 1000 mg via INTRAVENOUS

## 2014-09-11 MED ORDER — PROPOFOL 10 MG/ML IV BOLUS
INTRAVENOUS | Status: DC | PRN
Start: 2014-09-11 — End: 2014-09-11
  Administered 2014-09-11: 200 mg via INTRAVENOUS

## 2014-09-11 MED ORDER — OXYCODONE HCL 5 MG PO TABS
5.0000 mg | ORAL_TABLET | Freq: Once | ORAL | Status: AC
  Administered 2014-09-11: 5 mg via ORAL
  Filled 2014-09-11: qty 1

## 2014-09-11 MED ORDER — LIDOCAINE HCL 1 % IJ SOLN
INTRAMUSCULAR | Status: DC | PRN
  Administered 2014-09-11: 10 mL

## 2014-09-11 MED ORDER — FENTANYL CITRATE (PF) 100 MCG/2ML IJ SOLN
INTRAMUSCULAR | Status: AC
  Filled 2014-09-11: qty 4

## 2014-09-11 MED ORDER — LIDOCAINE HCL (CARDIAC) 20 MG/ML IV SOLN
INTRAVENOUS | Status: DC | PRN
  Administered 2014-09-11: 100 mg via INTRAVENOUS

## 2014-09-11 MED ORDER — ONDANSETRON HCL 4 MG/2ML IJ SOLN
INTRAMUSCULAR | Status: DC | PRN
  Administered 2014-09-11: 4 mg via INTRAVENOUS

## 2014-09-11 MED ORDER — GLYCINE 1.5 % IR SOLN
Status: DC | PRN
  Administered 2014-09-11: 3000 mL

## 2014-09-11 MED ORDER — FENTANYL CITRATE (PF) 100 MCG/2ML IJ SOLN
25.0000 ug | INTRAMUSCULAR | Status: DC | PRN
  Administered 2014-09-11: 25 ug via INTRAVENOUS
  Filled 2014-09-11: qty 1

## 2014-09-11 MED ORDER — OXYCODONE-ACETAMINOPHEN 5-325 MG PO TABS: 1.0000 | ORAL_TABLET | ORAL | Status: AC | PRN

## 2014-09-11 MED ORDER — MEPERIDINE HCL 25 MG/ML IJ SOLN
6.2500 mg | INTRAMUSCULAR | Status: DC | PRN
  Filled 2014-09-11: qty 1

## 2014-09-11 MED ORDER — FENTANYL CITRATE (PF) 100 MCG/2ML IJ SOLN
INTRAMUSCULAR | Status: AC
  Filled 2014-09-11: qty 2

## 2014-09-11 MED ORDER — LACTATED RINGERS IV SOLN
INTRAVENOUS | Status: DC
  Filled 2014-09-11: qty 1000

## 2014-09-11 MED ORDER — PROMETHAZINE HCL 25 MG/ML IJ SOLN
6.2500 mg | INTRAMUSCULAR | Status: DC | PRN
  Administered 2014-09-11: 15:00:00 via INTRAVENOUS
  Filled 2014-09-11: qty 1

## 2014-09-11 MED ORDER — PROMETHAZINE HCL 25 MG/ML IJ SOLN
INTRAMUSCULAR | Status: AC
  Filled 2014-09-11: qty 1

## 2014-09-11 SURGICAL SUPPLY — 29 items
CANISTER SUCT 3000ML (MISCELLANEOUS) ×2 IMPLANT
CATH ROBINSON RED A/P 16FR (CATHETERS) ×2 IMPLANT
COVER BACK TABLE 60X90IN (DRAPES) ×2 IMPLANT
DRAPE HYSTEROSCOPY (DRAPE) ×2 IMPLANT
DRAPE LG THREE QUARTER DISP (DRAPES) ×2 IMPLANT
DRSG TELFA 3X8 NADH (GAUZE/BANDAGES/DRESSINGS) ×2 IMPLANT
ELECT REM PT RETURN 9FT ADLT (ELECTROSURGICAL) ×2
ELECTRODE REM PT RTRN 9FT ADLT (ELECTROSURGICAL) ×1 IMPLANT
GLOVE BIOGEL PI IND STRL 6.5 (GLOVE) ×1 IMPLANT
GLOVE BIOGEL PI IND STRL 7.5 (GLOVE) ×2 IMPLANT
GLOVE BIOGEL PI INDICATOR 6.5 (GLOVE) ×1
GLOVE BIOGEL PI INDICATOR 7.5 (GLOVE) ×2
GLOVE ECLIPSE 6.5 STRL STRAW (GLOVE) ×2 IMPLANT
GLOVE SURG SS PI 7.5 STRL IVOR (GLOVE) ×2 IMPLANT
GLYCINE 1.5% IRRIG UROMATIC (IV SOLUTION) ×2 IMPLANT
GOWN STRL REUS W/TWL LRG LVL3 (GOWN DISPOSABLE) IMPLANT
GOWN STRL REUS W/TWL XL LVL3 (GOWN DISPOSABLE) ×4 IMPLANT
LEGGING LITHOTOMY PAIR STRL (DRAPES) ×2 IMPLANT
LOOP ANGLED CUTTING 22FR (CUTTING LOOP) IMPLANT
NEEDLE SPNL 22GX3.5 QUINCKE BK (NEEDLE) IMPLANT
NS IRRIG 500ML POUR BTL (IV SOLUTION) ×2 IMPLANT
PACK BASIN DAY SURGERY FS (CUSTOM PROCEDURE TRAY) ×2 IMPLANT
PAD OB MATERNITY 4.3X12.25 (PERSONAL CARE ITEMS) ×2 IMPLANT
SYR CONTROL 10ML LL (SYRINGE) IMPLANT
TOWEL OR 17X24 6PK STRL BLUE (TOWEL DISPOSABLE) ×4 IMPLANT
TRAY DSU PREP LF (CUSTOM PROCEDURE TRAY) ×2 IMPLANT
TUBING AQUILEX INFLOW (TUBING) ×2 IMPLANT
TUBING AQUILEX OUTFLOW (TUBING) ×2 IMPLANT
WATER STERILE IRR 500ML POUR (IV SOLUTION) IMPLANT

## 2014-09-11 NOTE — Brief Op Note (Addendum)
09/11/2014  1:15 PM  PATIENT:  Traci Schwartz  39 y.o. female  PRE-OPERATIVE DIAGNOSIS:  FIBROIDS  POST-OPERATIVE DIAGNOSIS:  FIBROIDS  PROCEDURE:  Procedure(s): DILATATION AND CURETTAGE /HYSTEROSCOPY , REMOVAL OF IUD (N/A)  SURGEON:  Surgeon(s) and Role:    * Allyn Kenner, DO - Primary  ANESTHESIA:   general and paracervical block  EBL:  Total I/O In: 300 [I.V.:300] Out: -   BLOOD ADMINISTERED:none  LOCAL MEDICATIONS USED:  LIDOCAINE  and Amount: 10 ml  SPECIMEN:  Source of Specimen:  [facility]  DISPOSITION OF SPECIMEN:  PATHOLOGY  COUNTS:  YES  PLAN OF CARE: Discharge to home after PACU  PATIENT DISPOSITION:  PACU - hemodynamically stable.
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
## 2014-09-11 NOTE — Anesthesia Preprocedure Evaluation (Addendum)
Anesthesia Evaluation  Patient identified by MRN, date of birth, ID band Patient awake    Reviewed: Allergy & Precautions, NPO status , Patient's Chart, lab work & pertinent test results  History of Anesthesia Complications (+) PONV  Airway Mallampati: II  TM Distance: >3 FB Neck ROM: Full    Dental no notable dental hx.    Pulmonary asthma , Current Smoker,  breath sounds clear to auscultation  Pulmonary exam normal       Cardiovascular negative cardio ROS  Rhythm:Regular Rate:Normal     Neuro/Psych negative neurological ROS  negative psych ROS   GI/Hepatic negative GI ROS, Neg liver ROS,   Endo/Other  Morbid obesity  Renal/GU negative Renal ROS  negative genitourinary   Musculoskeletal negative musculoskeletal ROS (+)   Abdominal   Peds negative pediatric ROS (+)  Hematology negative hematology ROS (+)   Anesthesia Other Findings   Reproductive/Obstetrics negative OB ROS                            Anesthesia Physical Anesthesia Plan  ASA: II  Anesthesia Plan: General   Post-op Pain Management:    Induction: Intravenous  Airway Management Planned: LMA  Additional Equipment:   Intra-op Plan:   Post-operative Plan: Extubation in OR  Informed Consent: I have reviewed the patients History and Physical, chart, labs and discussed the procedure including the risks, benefits and alternatives for the proposed anesthesia with the patient or authorized representative who has indicated his/her understanding and acceptance.   Dental advisory given  Plan Discussed with: CRNA  Anesthesia Plan Comments:         Anesthesia Quick Evaluation

## 2014-09-11 NOTE — Transfer of Care (Signed)
Immediate Anesthesia Transfer of Care Note  Patient: Traci Schwartz  Procedure(s) Performed: Procedure(s): DILATATION AND CURETTAGE /HYSTEROSCOPY , REMOVAL OF IUD (N/A)  Patient Location: PACU  Anesthesia Type:General  Level of Consciousness: awake and oriented  Airway & Oxygen Therapy: Patient Spontanous Breathing and Patient connected to nasal cannula oxygen  Post-op Assessment: Report given to RN  Post vital signs: Reviewed and stable  Last Vitals:  Filed Vitals:   09/11/14 1105  BP: 147/79  Pulse: 81  Temp: 37.2 C  Resp: 20    Complications: No apparent anesthesia complications

## 2014-09-11 NOTE — Discharge Instructions (Signed)

## 2014-09-11 NOTE — Anesthesia Postprocedure Evaluation (Signed)
  Anesthesia Post-op Note  Patient: Traci Schwartz  Procedure(s) Performed: Procedure(s) (LRB): DILATATION AND CURETTAGE /HYSTEROSCOPY , REMOVAL OF IUD (N/A)  Patient Location: PACU  Anesthesia Type: General  Level of Consciousness: awake and alert   Airway and Oxygen Therapy: Patient Spontanous Breathing  Post-op Pain: mild  Post-op Assessment: Post-op Vital signs reviewed, Patient's Cardiovascular Status Stable, Respiratory Function Stable, Patent Airway and No signs of Nausea or vomiting  Last Vitals:  Filed Vitals:   09/11/14 1105  BP: 147/79  Pulse: 81  Temp: 37.2 C  Resp: 20    Post-op Vital Signs: stable   Complications: No apparent anesthesia complications

## 2014-09-11 NOTE — Anesthesia Procedure Notes (Signed)
Procedure Name: LMA Insertion Date/Time: 09/11/2014 12:23 PM Performed by: Bethena Roys T Pre-anesthesia Checklist: Patient identified, Emergency Drugs available, Suction available and Patient being monitored Patient Re-evaluated:Patient Re-evaluated prior to inductionOxygen Delivery Method: Circle System Utilized Preoxygenation: Pre-oxygenation with 100% oxygen Intubation Type: IV induction Ventilation: Mask ventilation without difficulty LMA: LMA inserted LMA Size: 4.0 Number of attempts: 1 Airway Equipment and Method: Bite block Placement Confirmation: positive ETCO2 Dental Injury: Teeth and Oropharynx as per pre-operative assessment

## 2014-09-12 ENCOUNTER — Encounter (HOSPITAL_BASED_OUTPATIENT_CLINIC_OR_DEPARTMENT_OTHER): Payer: Self-pay | Admitting: Obstetrics and Gynecology

## 2014-10-02 NOTE — Op Note (Signed)
Traci Schwartz, Traci Schwartz               ACCOUNT NO.:  [id number]  MEDICAL RECORD NO.:  [id number]  LOCATION:                                 FACILITY:  PHYSICIAN:  Allyn Kenner, DO    DATE OF BIRTH:  October 11, 1975  DATE OF PROCEDURE:  09/11/2014 DATE OF DISCHARGE:                              OPERATIVE REPORT   PREOPERATIVE DIAGNOSES:  Fibroids, menorrhagia.  POSTOPERATIVE DIAGNOSES:  Fibroids, menorrhagia.  PROCEDURE:  Dilation and curettage with hysteroscopy and hysteroscopic removal of IUD.  SURGEON:  Allyn Kenner, DO.  ANESTHESIA:  General and paracervical block.  ESTIMATED BLOOD LOSS:  Negligible.  IV FLUIDS:  300 mL.  LOCAL MEDICATION:  Lidocaine 10 mL for paracervical block.  SPECIMEN:  Endometrial curettings to Pathology.  FINDINGS:  Normal-appearing endometrium with no obvious fibroids impeding on endometrial cavity.  IUD noted at the patient's right cornua.  DESCRIPTION OF PROCEDURE:  The patient was taken to the operating room, where anesthesia was administered and found to be adequate.  She was prepped and draped in a normal sterile fashion in dorsal lithotomy position.  A bladder was drained with flexible catheter placed.  Please see anesthesia report for amount.  Speculum was placed in the vagina and cervix was visualized and the anterior lip was grasped with a single- tooth tenaculum.  The uterus was sounded to approximately 9 cm and the cervix was dilated to accommodate the hysteroscope.  Hysteroscope was entered gently without difficulty and the endometrial cavity inspected. The IUD was noted and removed hysteroscopically with the grasper. Possible polypoid tissue at the internal cervical os was noted. Curettage of all 4 quadrants was performed and a hysteroscope was re- entered.  No abnormalities otherwise noted. All instruments were removed from the uterus, cervix, and vagina. Tenaculum sites were hemostatic.  The patient tolerated the  procedure well.  Sponge, lap, and needle counts were correct x2.  The patient was taken to recovery in stable condition.          ______________________________ Allyn Kenner, DO     [place]/MEDQ  D:  10/01/2014  T:  10/01/2014  Job:  [id number]

## 2014-11-24 ENCOUNTER — Other Ambulatory Visit: Payer: Self-pay

## 2014-11-27 LAB — CYTOLOGY - PAP
